# Patient Record
Sex: Female | Born: 1990
Health system: Southern US, Community
[De-identification: ages and names within clinical notes are randomized; demographics above are authoritative.]

## PROBLEM LIST (undated history)

## (undated) DIAGNOSIS — F419 Anxiety disorder, unspecified: Secondary | ICD-10-CM

## (undated) HISTORY — PX: NO PAST SURGERIES: SHX2092

## (undated) HISTORY — DX: Anxiety disorder, unspecified: F41.9

---
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
## 2008-04-25 ENCOUNTER — Inpatient Hospital Stay: Payer: Self-pay | Admitting: Obstetrics and Gynecology

## 2014-12-25 LAB — CBC AND DIFFERENTIAL
HCT: 43 % (ref 36–46)
Hemoglobin: 15.6 g/dL (ref 12.0–16.0)
NEUTROS ABS: 5 /uL
PLATELETS: 272 10*3/uL (ref 150–399)
WBC: 7.1 10^3/mL

## 2014-12-25 LAB — BASIC METABOLIC PANEL
BUN: 11 mg/dL (ref 4–21)
CREATININE: 0.8 mg/dL (ref 0.5–1.1)
GLUCOSE: 112 mg/dL
Potassium: 4 mmol/L (ref 3.4–5.3)
Sodium: 138 mmol/L (ref 137–147)

## 2014-12-25 LAB — LIPID PANEL
Cholesterol: 214 mg/dL — AB (ref 0–200)
HDL: 50 mg/dL (ref 35–70)
LDL CALC: 144 mg/dL
TRIGLYCERIDES: 101 mg/dL (ref 40–160)

## 2014-12-25 LAB — TSH: TSH: 2.99 u[IU]/mL (ref 0.41–5.90)

## 2015-01-01 ENCOUNTER — Telehealth: Payer: Self-pay | Admitting: Family Medicine

## 2015-01-01 NOTE — Telephone Encounter (Signed)
PT stated she started Venlafaxine on Friday 12/27/14 and she doesn't feel like it is helping and she feels like she is having muscle spasms and feels shaky. Pt request to speak with a nurse or maybe try something different. [facility] [address]

## 2015-01-01 NOTE — Telephone Encounter (Signed)
She should continue taking just one capsule a day for at least another week. She should call early next week if she is still having the muscle aches.

## 2015-01-01 NOTE — Telephone Encounter (Signed)
Patient reports that she was seen in office last Wednesday and prescribed Venlafaxine, she states that sig was one tablet tablet then to increase dosage to two tablets daily after six days?Patient continued in saying that since starting medication she has experienced muscle aches, tremors and fatigue ( especially in the AM). Patient takes medication at night time and is suppose to increase dosage today and wanted to know if she should increase to two tablets as prescribed? Or if what she is experiencing is just a common side effect from medication that will subside? Patient advised that Nadine CountsBob is out of the office this afternoon. Please advise. KW

## 2015-01-02 NOTE — Telephone Encounter (Signed)
I will send in generic Paxil to [facility] [address] Start at 1/2 pill daily for at least a week.

## 2015-01-02 NOTE — Telephone Encounter (Signed)
Patient advised as directed below. Patient states she is so shaky that she can not function continuing taking the Venlafaxine for another week. Patient states she was told if she was unable to handle the medication to call back to get a different medication. Please advise.

## 2015-01-02 NOTE — Telephone Encounter (Signed)
Patient notified. Patient canceled f/u appt for 01/06/15 and will reschedule for the following week.

## 2015-01-02 NOTE — Telephone Encounter (Signed)
Pt called back. Lowella Bandyikki took the call. Thanks TNP

## 2015-01-06 ENCOUNTER — Ambulatory Visit: Payer: Self-pay | Admitting: Family Medicine

## 2015-06-25 ENCOUNTER — Other Ambulatory Visit: Payer: Self-pay | Admitting: Family Medicine

## 2015-06-25 ENCOUNTER — Telehealth: Payer: Self-pay | Admitting: Family Medicine

## 2015-06-25 DIAGNOSIS — F410 Panic disorder [episodic paroxysmal anxiety] without agoraphobia: Secondary | ICD-10-CM

## 2015-06-25 MED ORDER — PAROXETINE HCL 20 MG PO TABS: 20.0000 mg | ORAL_TABLET | Freq: Every day | ORAL | Status: DC

## 2015-06-25 NOTE — Telephone Encounter (Signed)
Refill request

## 2015-06-25 NOTE — Telephone Encounter (Signed)
Pt needs refill for her generic Paxil 20 mg  [facility] [address]  Call back is [phone]  Thanks Barth Kirkseri

## 2015-07-05 ENCOUNTER — Other Ambulatory Visit: Payer: Self-pay | Admitting: Family Medicine

## 2015-07-07 ENCOUNTER — Other Ambulatory Visit: Payer: Self-pay | Admitting: Family Medicine

## 2015-09-02 ENCOUNTER — Other Ambulatory Visit: Payer: Self-pay | Admitting: Family Medicine

## 2016-02-26 DIAGNOSIS — F419 Anxiety disorder, unspecified: Secondary | ICD-10-CM | POA: Insufficient documentation

## 2016-02-26 DIAGNOSIS — F410 Panic disorder [episodic paroxysmal anxiety] without agoraphobia: Secondary | ICD-10-CM | POA: Insufficient documentation

## 2016-02-26 DIAGNOSIS — J309 Allergic rhinitis, unspecified: Secondary | ICD-10-CM | POA: Insufficient documentation

## 2016-02-26 DIAGNOSIS — F4000 Agoraphobia, unspecified: Secondary | ICD-10-CM | POA: Insufficient documentation

## 2016-03-14 ENCOUNTER — Other Ambulatory Visit: Payer: Self-pay | Admitting: Family Medicine

## 2016-03-15 ENCOUNTER — Encounter: Payer: Self-pay | Admitting: Family Medicine

## 2016-03-15 ENCOUNTER — Ambulatory Visit (INDEPENDENT_AMBULATORY_CARE_PROVIDER_SITE_OTHER): Payer: BLUE CROSS/BLUE SHIELD | Admitting: Family Medicine

## 2016-03-15 VITALS — BP 102/84 | HR 76 | Temp 99.0°F | Wt 179.4 lb

## 2016-03-15 DIAGNOSIS — E785 Hyperlipidemia, unspecified: Secondary | ICD-10-CM

## 2016-03-15 DIAGNOSIS — F419 Anxiety disorder, unspecified: Secondary | ICD-10-CM

## 2016-03-15 DIAGNOSIS — Z131 Encounter for screening for diabetes mellitus: Secondary | ICD-10-CM

## 2016-03-15 NOTE — Progress Notes (Signed)
Subjective:     Patient ID: Tara Foley, female   DOB: 03/15/1991, 25 y.o.   MRN: [id number]  HPI  Chief Complaint  Patient presents with  . Anxiety    Patient is present in office today to follow up for anxiety. According to phone message dated on 01/01/15 patient medication was changed from Venlafaxine to Paxil 20mg . Patient states today since starting Paxil her symptoms have greatly improved, no feelings of anxiety and has had no need to continue Klonopin  States she has no further panic attacks and agoraphobia is resolved. [facility] though no labs.   Review of Systems     Objective:   Physical Exam  Constitutional: She appears well-developed and well-nourished. No distress.  Psychiatric: She has a normal mood and affect. Her behavior is normal.       Assessment:    1. Anxiety: will refill paroxetine  2. Hyperlipidemia - Lipid panel  3. Screening for diabetes mellitus - Renal function panel    Plan:    Further f/u pending lab work

## 2016-03-16 DIAGNOSIS — E785 Hyperlipidemia, unspecified: Secondary | ICD-10-CM | POA: Diagnosis not present

## 2016-03-16 DIAGNOSIS — Z131 Encounter for screening for diabetes mellitus: Secondary | ICD-10-CM | POA: Diagnosis not present

## 2016-03-17 ENCOUNTER — Telehealth: Payer: Self-pay

## 2016-03-17 LAB — RENAL FUNCTION PANEL
Albumin: 4.2 g/dL (ref 3.5–5.5)
BUN/Creatinine Ratio: 13 (ref 9–23)
BUN: 11 mg/dL (ref 6–20)
CALCIUM: 9.6 mg/dL (ref 8.7–10.2)
CHLORIDE: 99 mmol/L (ref 96–106)
CO2: 23 mmol/L (ref 18–29)
CREATININE: 0.82 mg/dL (ref 0.57–1.00)
GFR calc Af Amer: 113 mL/min/{1.73_m2} (ref >59)
GFR calc non Af Amer: 98 mL/min/{1.73_m2} (ref >59)
Glucose: 91 mg/dL (ref 65–99)
PHOSPHORUS: 4 mg/dL (ref 2.5–4.5)
POTASSIUM: 4.3 mmol/L (ref 3.5–5.2)
SODIUM: 139 mmol/L (ref 134–144)

## 2016-03-17 LAB — LIPID PANEL
CHOLESTEROL TOTAL: 231 mg/dL — AB (ref 100–199)
Chol/HDL Ratio: 6.2 ratio units — ABNORMAL HIGH (ref 0.0–4.4)
HDL: 37 mg/dL — ABNORMAL LOW (ref >39)
LDL CALC: 150 mg/dL — AB (ref 0–99)
TRIGLYCERIDES: 218 mg/dL — AB (ref 0–149)
VLDL Cholesterol Cal: 44 mg/dL — ABNORMAL HIGH (ref 5–40)

## 2016-03-17 NOTE — Telephone Encounter (Signed)
-----   Message from Anola Gurneyobert Chauvin, [place] sent at 03/17/2016  7:38 AM EDT ----- Normal sugar but cholesterol is elevated. Recommend low fat food choices and regular exercise 30 minutes daily.

## 2016-03-17 NOTE — Telephone Encounter (Signed)
Patient has been advised. KW 

## 2016-03-21 DIAGNOSIS — S91331A Puncture wound without foreign body, right foot, initial encounter: Secondary | ICD-10-CM | POA: Diagnosis not present

## 2016-06-01 DIAGNOSIS — Z309 Encounter for contraceptive management, unspecified: Secondary | ICD-10-CM | POA: Diagnosis not present

## 2016-06-01 DIAGNOSIS — Z3041 Encounter for surveillance of contraceptive pills: Secondary | ICD-10-CM | POA: Diagnosis not present

## 2016-06-01 DIAGNOSIS — Z01419 Encounter for gynecological examination (general) (routine) without abnormal findings: Secondary | ICD-10-CM | POA: Diagnosis not present

## 2016-06-01 DIAGNOSIS — Z124 Encounter for screening for malignant neoplasm of cervix: Secondary | ICD-10-CM | POA: Diagnosis not present

## 2017-01-06 ENCOUNTER — Telehealth: Payer: Self-pay | Admitting: Family Medicine

## 2017-01-06 ENCOUNTER — Other Ambulatory Visit: Payer: Self-pay | Admitting: Family Medicine

## 2017-01-06 MED ORDER — PAROXETINE HCL 20 MG PO TABS: 20.0000 mg | ORAL_TABLET | Freq: Every day | ORAL | 0 refills | Status: DC

## 2017-01-06 NOTE — Telephone Encounter (Signed)
[facility] faxed a refill request on the following medications:  PARoxetine (PAXIL) 20 MG tablet.  Take 1 tablet by mouth every day.  90 day supply.  [facility]

## 2017-01-06 NOTE — Telephone Encounter (Signed)
Refilled-will be time for office visit this summer

## 2017-01-12 DIAGNOSIS — N390 Urinary tract infection, site not specified: Secondary | ICD-10-CM | POA: Diagnosis not present

## 2017-03-15 ENCOUNTER — Ambulatory Visit (INDEPENDENT_AMBULATORY_CARE_PROVIDER_SITE_OTHER): Payer: BLUE CROSS/BLUE SHIELD | Admitting: Family Medicine

## 2017-03-15 ENCOUNTER — Encounter: Payer: Self-pay | Admitting: Family Medicine

## 2017-03-15 VITALS — BP 104/76 | HR 125 | Temp 98.4°F | Resp 16 | Wt 191.2 lb

## 2017-03-15 DIAGNOSIS — A084 Viral intestinal infection, unspecified: Secondary | ICD-10-CM | POA: Diagnosis not present

## 2017-03-15 NOTE — Patient Instructions (Signed)
Discussed use of imodium, Gatorade or Pedialyte, and eating as tolerated.

## 2017-03-15 NOTE — Progress Notes (Signed)
Subjective:     Patient ID: Tara Foley, female   DOB: 06/26/1991, 26 y.o.   MRN: [id number]  HPI  Chief Complaint  Patient presents with  . Diarrhea    Patient comes in office today with complaints of diarrhea for the past 3 days. Patient states that on Saturday 03/12/17 her [place] went [facility] to eat [facility] [employer] and within 24hrs everyone in household complained of diarrhea and abdominal pain. Patient reports that she has had loose watery stools, body chills and low grade fever of 99.1.    States nausea has abated. Has had two watery stools today. Was able to tolerate cheese toast last night.   Review of Systems  Psychiatric/Behavioral:       States paroxetine is working well for her anxiety. Has formal f/u in the next few weeks.       Objective:   Physical Exam  Constitutional: She appears well-developed and well-nourished. No distress.  Abdominal: Bowel sounds are normal. There is no tenderness. There is no guarding.       Assessment:    1. Viral gastroenteritis: improving     Plan:    Discussed use of imodium, Gatorade or similar and eating as tolerated.

## 2017-04-07 ENCOUNTER — Other Ambulatory Visit: Payer: Self-pay | Admitting: Family Medicine

## 2017-04-07 ENCOUNTER — Telehealth: Payer: Self-pay | Admitting: Family Medicine

## 2017-04-07 MED ORDER — PAROXETINE HCL 20 MG PO TABS: 20.0000 mg | ORAL_TABLET | Freq: Every day | ORAL | 0 refills | Status: DC

## 2017-04-07 NOTE — Telephone Encounter (Signed)
Pt needs refill on her paroxetine 20 mg  [facility]  Thanks teri

## 2017-04-07 NOTE — Telephone Encounter (Signed)
Please review. KW 

## 2017-04-11 ENCOUNTER — Other Ambulatory Visit: Payer: Self-pay

## 2017-04-11 ENCOUNTER — Other Ambulatory Visit: Payer: Self-pay | Admitting: Obstetrics and Gynecology

## 2017-04-11 MED ORDER — NORETHIN ACE-ETH ESTRAD-FE 1-20 MG-MCG PO TABS: 1.0000 | ORAL_TABLET | Freq: Every day | ORAL | 1 refills | Status: DC

## 2017-04-11 NOTE — Telephone Encounter (Signed)
Refilled medication for pt to last her until her annual 11/6

## 2017-05-05 ENCOUNTER — Other Ambulatory Visit: Payer: Self-pay | Admitting: Obstetrics and Gynecology

## 2017-05-05 MED ORDER — NORETHIN ACE-ETH ESTRAD-FE 1-20 MG-MCG PO TABS: 1.0000 | ORAL_TABLET | Freq: Every day | ORAL | 1 refills | Status: DC

## 2017-05-05 NOTE — Telephone Encounter (Signed)
Pt aware rx sent in. 

## 2017-05-05 NOTE — Telephone Encounter (Signed)
Pt is schedule 06/07/17 with [facility] needs refill on birthcontrol to get to her appt. [facility]. Pt contact pt.

## 2017-06-07 ENCOUNTER — Ambulatory Visit (INDEPENDENT_AMBULATORY_CARE_PROVIDER_SITE_OTHER): Payer: BLUE CROSS/BLUE SHIELD | Admitting: Obstetrics and Gynecology

## 2017-06-07 ENCOUNTER — Encounter: Payer: Self-pay | Admitting: Obstetrics and Gynecology

## 2017-06-07 VITALS — BP 134/80 | Ht 66.0 in | Wt 195.0 lb

## 2017-06-07 DIAGNOSIS — Z3041 Encounter for surveillance of contraceptive pills: Secondary | ICD-10-CM | POA: Diagnosis not present

## 2017-06-07 DIAGNOSIS — Z124 Encounter for screening for malignant neoplasm of cervix: Secondary | ICD-10-CM | POA: Diagnosis not present

## 2017-06-07 DIAGNOSIS — Z01419 Encounter for gynecological examination (general) (routine) without abnormal findings: Secondary | ICD-10-CM | POA: Diagnosis not present

## 2017-06-07 MED ORDER — NORETHIN ACE-ETH ESTRAD-FE 1-20 MG-MCG PO TABS: 1.0000 | ORAL_TABLET | Freq: Every day | ORAL | 3 refills | Status: DC

## 2017-06-07 NOTE — Patient Instructions (Signed)
I value your feedback and appreciate you entrusting us with your care. If you get a [place] patient survey, I would appreciate you taking the time to let us know what your experience was like. Thank you! 

## 2017-06-07 NOTE — Progress Notes (Signed)
PCP:  Anola Gurneyhauvin, Robert, PA   Chief Complaint  Patient presents with  . Annual Exam     HPI:      [name] is a 26 y.o. G1P1001 who LMP was Patient's last menstrual period was 06/02/2017., presents today for her annual examination.  Her menses are regular every 28-30 days, lasting 3 days.  Dysmenorrhea none. She does not have intermenstrual bleeding.  Sex activity: single partner, contraception - OCP (estrogen/progesterone).  Last Pap: 06/01/16  Results were: no abnormalities  Hx of STDs: none  There is no FH of breast cancer. There is no FH of ovarian cancer. The patient does do self-breast exams.  Tobacco use: The patient denies current or previous tobacco use. Alcohol use: none No drug use.  Exercise: moderately active  She does get adequate calcium but not Vitamin D in her diet.   Past Medical History:  Diagnosis Date  . Anxiety     Past Surgical History:  Procedure Laterality Date  . NO PAST SURGERIES      Family History  Problem Relation Age of Onset  . Hypertension Mother     Social History   Socioeconomic History  . Marital status: Single    Spouse name: Not on file  . Number of children: Not on file  . Years of education: Not on file  . Highest education level: Not on file  Social Needs  . Financial resource strain: Not on file  . Food insecurity - worry: Not on file  . Food insecurity - inability: Not on file  . Transportation needs - medical: Not on file  . Transportation needs - non-medical: Not on file  Occupational History  . Not on file  Tobacco Use  . Smoking status: Never Smoker  . Smokeless tobacco: Never Used  Substance and Sexual Activity  . Alcohol use: No    Frequency: Never  . Drug use: No  . Sexual activity: Yes    Birth control/protection: Pill  Other Topics Concern  . Not on file  Social History Narrative  . Not on file    Current Meds  Medication Sig  . norethindrone-ethinyl estradiol (BLISOVI FE 1/20)  1-20 MG-MCG tablet Take 1 tablet daily by mouth.  Marland Kitchen. PARoxetine (PAXIL) 20 MG tablet Take 1 tablet (20 mg total) by mouth daily.  . [DISCONTINUED] norethindrone-ethinyl estradiol (BLISOVI FE 1/20) 1-20 MG-MCG tablet Take 1 tablet by mouth daily.     ROS:  Review of Systems  Constitutional: Negative for fatigue, fever and unexpected weight change.  Respiratory: Negative for cough, shortness of breath and wheezing.   Cardiovascular: Negative for chest pain, palpitations and leg swelling.  Gastrointestinal: Negative for blood in stool, constipation, diarrhea, nausea and vomiting.  Endocrine: Negative for cold intolerance, heat intolerance and polyuria.  Genitourinary: Negative for dyspareunia, dysuria, flank pain, frequency, genital sores, hematuria, menstrual problem, pelvic pain, urgency, vaginal bleeding, vaginal discharge and vaginal pain.  Musculoskeletal: Negative for back pain, joint swelling and myalgias.  Skin: Negative for rash.  Neurological: Negative for dizziness, syncope, light-headedness, numbness and headaches.  Hematological: Negative for adenopathy.  Psychiatric/Behavioral: Negative for agitation, confusion, sleep disturbance and suicidal ideas. The patient is not nervous/anxious.      Objective: BP 134/80   Ht 5\' 6"  (1.676 m)   Wt 195 lb (88.5 kg)   LMP 06/02/2017   BMI 31.47 kg/m    Physical Exam  Constitutional: She is oriented to person, place, and time. She appears well-developed and  well-nourished.  Genitourinary: Vagina normal and uterus normal. There is no rash or tenderness on the right labia. There is no rash or tenderness on the left labia. No erythema or tenderness in the vagina. No vaginal discharge found. Right adnexum does not display mass and does not display tenderness. Left adnexum does not display mass and does not display tenderness. Cervix does not exhibit motion tenderness or polyp. Uterus is not enlarged or tender.  Neck: Normal range of motion.  No thyromegaly present.  Cardiovascular: Normal rate, regular rhythm and normal heart sounds.  No murmur heard. Pulmonary/Chest: Effort normal and breath sounds normal. Right breast exhibits no mass, no nipple discharge, no skin change and no tenderness. Left breast exhibits no mass, no nipple discharge, no skin change and no tenderness.  Abdominal: Soft. There is no tenderness. There is no guarding.  Musculoskeletal: Normal range of motion.  Neurological: She is alert and oriented to person, place, and time. No cranial nerve deficit.  Psychiatric: She has a normal mood and affect. Her behavior is normal.  Vitals reviewed.   Assessment/Plan: Encounter for annual routine gynecological examination  Cervical cancer screening - Plan: IGP, rfx Aptima HPV ASCU  Encounter for surveillance of contraceptive pills - OCP RF. - Plan: norethindrone-ethinyl estradiol (BLISOVI FE 1/20) 1-20 MG-MCG tablet  Meds ordered this encounter  Medications  . norethindrone-ethinyl estradiol (BLISOVI FE 1/20) 1-20 MG-MCG tablet    Sig: Take 1 tablet daily by mouth.    Dispense:  3 Package    Refill:  3             GYN counsel adequate intake of calcium and vitamin D, diet and exercise     F/U  Return in about 1 year (around 06/07/2018).  Estelene Carmack B. Lashina Milles, PA-C 06/07/2017 9:27 AM

## 2017-06-09 LAB — IGP, RFX APTIMA HPV ASCU: PAP SMEAR COMMENT: 0

## 2017-07-13 ENCOUNTER — Other Ambulatory Visit: Payer: Self-pay | Admitting: Family Medicine

## 2017-07-14 MED ORDER — PAROXETINE HCL 20 MG PO TABS: 20.0000 mg | ORAL_TABLET | Freq: Every day | ORAL | 1 refills | Status: DC

## 2017-10-10 DIAGNOSIS — L719 Rosacea, unspecified: Secondary | ICD-10-CM | POA: Diagnosis not present

## 2017-10-10 DIAGNOSIS — L858 Other specified epidermal thickening: Secondary | ICD-10-CM | POA: Diagnosis not present

## 2017-10-10 DIAGNOSIS — L821 Other seborrheic keratosis: Secondary | ICD-10-CM | POA: Diagnosis not present

## 2017-10-10 DIAGNOSIS — D229 Melanocytic nevi, unspecified: Secondary | ICD-10-CM | POA: Diagnosis not present

## 2018-06-06 ENCOUNTER — Other Ambulatory Visit: Payer: Self-pay | Admitting: Obstetrics and Gynecology

## 2018-06-06 ENCOUNTER — Other Ambulatory Visit: Payer: Self-pay

## 2018-06-06 DIAGNOSIS — Z3041 Encounter for surveillance of contraceptive pills: Secondary | ICD-10-CM

## 2018-06-06 MED ORDER — NORETHIN ACE-ETH ESTRAD-FE 1-20 MG-MCG PO TABS: 1.0000 | ORAL_TABLET | Freq: Every day | ORAL | 0 refills | Status: DC

## 2018-06-06 NOTE — Telephone Encounter (Signed)
Pt calling for refill of bc to last her to her appt 12/11.  Uses [facility]  [phone] pt aware refills sent in.

## 2018-07-11 NOTE — Progress Notes (Signed)
PCP:  Anola Gurney, PA   Chief Complaint  Patient presents with  . Gynecologic Exam     HPI:      [name] is a 27 y.o. G1P1001 who LMP was Patient's last menstrual period was 06/28/2018 (exact date)., presents today for her annual examination.  Her menses are regular every 28-30 days, lasting 3-4 days.  Dysmenorrhea none. She does not have intermenstrual bleeding.  Sex activity: single partner, contraception - OCP (estrogen/progesterone).  Last Pap: 06/07/17  Results were: no abnormalities  Hx of STDs: none  There is no FH of breast cancer. There is no FH of ovarian cancer. The patient does do self-breast exams.  Tobacco use: The patient denies current or previous tobacco use. Alcohol use: none No drug use.  Exercise: moderately active  She does get adequate calcium and Vitamin D in her diet. She would like fasting labs. Has had borderline lipids in past.   Past Medical History:  Diagnosis Date  . Anxiety     Past Surgical History:  Procedure Laterality Date  . NO PAST SURGERIES      Family History  Problem Relation Age of Onset  . Hypertension Mother     Social History   Socioeconomic History  . Marital status: Married    Spouse name: Not on file  . Number of children: Not on file  . Years of education: Not on file  . Highest education level: Not on file  Occupational History  . Not on file  Social Needs  . Financial resource strain: Not on file  . Food insecurity:    Worry: Not on file    Inability: Not on file  . Transportation needs:    Medical: Not on file    Non-medical: Not on file  Tobacco Use  . Smoking status: Never Smoker  . Smokeless tobacco: Never Used  Substance and Sexual Activity  . Alcohol use: No    Frequency: Never  . Drug use: No  . Sexual activity: Yes    Birth control/protection: Pill  Lifestyle  . Physical activity:    Days per week: Not on file    Minutes per session: Not on file  . Stress: Not on file    Relationships  . Social connections:    Talks on phone: Not on file    Gets together: Not on file    Attends religious service: Not on file    Active member of club or organization: Not on file    Attends meetings of clubs or organizations: Not on file    Relationship status: Not on file  . Intimate partner violence:    Fear of current or ex partner: Not on file    Emotionally abused: Not on file    Physically abused: Not on file    Forced sexual activity: Not on file  Other Topics Concern  . Not on file  Social History Narrative  . Not on file    Current Meds  Medication Sig  . norethindrone-ethinyl estradiol (BLISOVI FE 1/20) 1-20 MG-MCG tablet Take 1 tablet by mouth daily.  . [DISCONTINUED] norethindrone-ethinyl estradiol (BLISOVI FE 1/20) 1-20 MG-MCG tablet Take 1 tablet by mouth daily.     ROS:  Review of Systems  Constitutional: Negative for fatigue, fever and unexpected weight change.  Respiratory: Negative for cough, shortness of breath and wheezing.   Cardiovascular: Negative for chest pain, palpitations and leg swelling.  Gastrointestinal: Negative for blood in stool, constipation, diarrhea,  nausea and vomiting.  Endocrine: Negative for cold intolerance, heat intolerance and polyuria.  Genitourinary: Negative for dyspareunia, dysuria, flank pain, frequency, genital sores, hematuria, menstrual problem, pelvic pain, urgency, vaginal bleeding, vaginal discharge and vaginal pain.  Musculoskeletal: Negative for back pain, joint swelling and myalgias.  Skin: Negative for rash.  Neurological: Negative for dizziness, syncope, light-headedness, numbness and headaches.  Hematological: Negative for adenopathy.  Psychiatric/Behavioral: Negative for agitation, confusion, sleep disturbance and suicidal ideas. The patient is not nervous/anxious.      Objective: BP (!) 150/84   Pulse (!) 110   Ht 5\' 6"  (1.676 m)   Wt 191 lb (86.6 kg)   LMP 06/28/2018 (Exact Date)   BMI  30.83 kg/m    Physical Exam  Constitutional: She is oriented to person, place, and time. She appears well-developed and well-nourished.  Genitourinary: Vagina normal and uterus normal. There is no rash or tenderness on the right labia. There is no rash or tenderness on the left labia. No erythema or tenderness in the vagina. No vaginal discharge found. Right adnexum does not display mass and does not display tenderness. Left adnexum does not display mass and does not display tenderness. Cervix does not exhibit motion tenderness or polyp. Uterus is not enlarged or tender.  Neck: Normal range of motion. No thyromegaly present.  Cardiovascular: Normal rate, regular rhythm and normal heart sounds.  No murmur heard. Pulmonary/Chest: Effort normal and breath sounds normal. Right breast exhibits no mass, no nipple discharge, no skin change and no tenderness. Left breast exhibits no mass, no nipple discharge, no skin change and no tenderness.  Abdominal: Soft. There is no tenderness. There is no guarding.  Musculoskeletal: Normal range of motion.  Neurological: She is alert and oriented to person, place, and time. No cranial nerve deficit.  Psychiatric: She has a normal mood and affect. Her behavior is normal.  Vitals reviewed.   Assessment/Plan: Encounter for annual routine gynecological examination  Cervical cancer screening - Plan: Cytology - PAP  Encounter for surveillance of contraceptive pills - OCP RF - Plan: norethindrone-ethinyl estradiol (BLISOVI FE 1/20) 1-20 MG-MCG tablet  Blood tests for routine general physical examination - Plan: Comprehensive metabolic panel, Lipid panel, Hemoglobin A1c  Screening cholesterol level - Plan: Lipid panel  Screening for diabetes mellitus - Plan: Hemoglobin A1c  BMI 30.0-30.9,adult - Plan: Hemoglobin A1c  Meds ordered this encounter  Medications  . norethindrone-ethinyl estradiol (BLISOVI FE 1/20) 1-20 MG-MCG tablet    Sig: Take 1 tablet by  mouth daily.    Dispense:  3 Package    Refill:  3    Order Specific Question:   Supervising Provider    Answer:   Nadara MustardHARRIS, ROBERT P [[id number]]             GYN counsel adequate intake of calcium and vitamin D, diet and exercise     F/U  Return in about 1 year (around 07/13/2019).  Alicia B. Copland, PA-C 07/12/2018 9:05 AM

## 2018-07-11 NOTE — Patient Instructions (Signed)
I value your feedback and entrusting us with your care. If you get a [place] patient survey, I would appreciate you taking the time to let us know about your experience today. Thank you! 

## 2018-07-12 ENCOUNTER — Ambulatory Visit (INDEPENDENT_AMBULATORY_CARE_PROVIDER_SITE_OTHER): Payer: BLUE CROSS/BLUE SHIELD | Admitting: Obstetrics and Gynecology

## 2018-07-12 ENCOUNTER — Other Ambulatory Visit (HOSPITAL_COMMUNITY)
Admission: RE | Admit: 2018-07-12 | Discharge: 2018-07-12 | Disposition: A | Discharge status: Home / Self Care | Payer: BLUE CROSS/BLUE SHIELD | Source: Ambulatory Visit | Attending: Obstetrics and Gynecology | Admitting: Obstetrics and Gynecology

## 2018-07-12 ENCOUNTER — Encounter: Payer: Self-pay | Admitting: Obstetrics and Gynecology

## 2018-07-12 VITALS — BP 150/84 | HR 110 | Ht 66.0 in | Wt 191.0 lb

## 2018-07-12 DIAGNOSIS — Z01419 Encounter for gynecological examination (general) (routine) without abnormal findings: Secondary | ICD-10-CM

## 2018-07-12 DIAGNOSIS — Z3041 Encounter for surveillance of contraceptive pills: Secondary | ICD-10-CM

## 2018-07-12 DIAGNOSIS — Z13220 Encounter for screening for lipoid disorders: Secondary | ICD-10-CM | POA: Diagnosis not present

## 2018-07-12 DIAGNOSIS — Z124 Encounter for screening for malignant neoplasm of cervix: Secondary | ICD-10-CM | POA: Insufficient documentation

## 2018-07-12 DIAGNOSIS — Z0000 Encounter for general adult medical examination without abnormal findings: Secondary | ICD-10-CM | POA: Diagnosis not present

## 2018-07-12 DIAGNOSIS — Z6830 Body mass index (BMI) 30.0-30.9, adult: Secondary | ICD-10-CM | POA: Diagnosis not present

## 2018-07-12 DIAGNOSIS — Z131 Encounter for screening for diabetes mellitus: Secondary | ICD-10-CM | POA: Diagnosis not present

## 2018-07-12 MED ORDER — NORETHIN ACE-ETH ESTRAD-FE 1-20 MG-MCG PO TABS: 1.0000 | ORAL_TABLET | Freq: Every day | ORAL | 3 refills | Status: DC

## 2018-07-13 LAB — COMPREHENSIVE METABOLIC PANEL
ALK PHOS: 71 IU/L (ref 39–117)
ALT: 67 IU/L — AB (ref 0–32)
AST: 42 IU/L — ABNORMAL HIGH (ref 0–40)
Albumin/Globulin Ratio: 1.6 (ref 1.2–2.2)
Albumin: 4.6 g/dL (ref 3.5–5.5)
BILIRUBIN TOTAL: 0.2 mg/dL (ref 0.0–1.2)
BUN/Creatinine Ratio: 13 (ref 9–23)
BUN: 10 mg/dL (ref 6–20)
CALCIUM: 9.9 mg/dL (ref 8.7–10.2)
CO2: 20 mmol/L (ref 20–29)
CREATININE: 0.75 mg/dL (ref 0.57–1.00)
Chloride: 103 mmol/L (ref 96–106)
GFR calc non Af Amer: 108 mL/min/{1.73_m2} (ref >59)
GFR, EST AFRICAN AMERICAN: 125 mL/min/{1.73_m2} (ref >59)
GLOBULIN, TOTAL: 2.9 g/dL (ref 1.5–4.5)
Glucose: 97 mg/dL (ref 65–99)
Potassium: 4.2 mmol/L (ref 3.5–5.2)
Sodium: 143 mmol/L (ref 134–144)
Total Protein: 7.5 g/dL (ref 6.0–8.5)

## 2018-07-13 LAB — LIPID PANEL
CHOLESTEROL TOTAL: 237 mg/dL — AB (ref 100–199)
Chol/HDL Ratio: 6.8 ratio — ABNORMAL HIGH (ref 0.0–4.4)
HDL: 35 mg/dL — AB (ref >39)
LDL Calculated: 167 mg/dL — ABNORMAL HIGH (ref 0–99)
Triglycerides: 177 mg/dL — ABNORMAL HIGH (ref 0–149)
VLDL Cholesterol Cal: 35 mg/dL (ref 5–40)

## 2018-07-13 LAB — HEMOGLOBIN A1C
ESTIMATED AVERAGE GLUCOSE: 111 mg/dL
HEMOGLOBIN A1C: 5.5 % (ref 4.8–5.6)

## 2018-07-13 LAB — CYTOLOGY - PAP: Diagnosis: NEGATIVE

## 2018-07-13 NOTE — Progress Notes (Signed)
Pt aware.

## 2018-07-13 NOTE — Progress Notes (Signed)
Pls let pt know labs released through MyChart. Has slightly elevated liver enzymes, probably due to weight/fatty liver. Also has high cholesterol. Diet/exercise/wt loss for cholesterol and LFTs. Pt needs to f/u with PCP for mgmt.

## 2018-09-27 DIAGNOSIS — N390 Urinary tract infection, site not specified: Secondary | ICD-10-CM | POA: Diagnosis not present

## 2018-09-27 DIAGNOSIS — R0981 Nasal congestion: Secondary | ICD-10-CM | POA: Diagnosis not present

## 2018-10-09 ENCOUNTER — Encounter: Payer: Self-pay | Admitting: Physician Assistant

## 2018-10-09 ENCOUNTER — Ambulatory Visit: Payer: BLUE CROSS/BLUE SHIELD | Admitting: Physician Assistant

## 2018-10-09 VITALS — BP 137/99 | HR 129 | Temp 98.7°F | Wt 178.8 lb

## 2018-10-09 DIAGNOSIS — F419 Anxiety disorder, unspecified: Secondary | ICD-10-CM

## 2018-10-09 DIAGNOSIS — Z111 Encounter for screening for respiratory tuberculosis: Secondary | ICD-10-CM

## 2018-10-09 DIAGNOSIS — Z789 Other specified health status: Secondary | ICD-10-CM | POA: Diagnosis not present

## 2018-10-09 MED ORDER — PAROXETINE HCL 20 MG PO TABS: 20.0000 mg | ORAL_TABLET | Freq: Every day | ORAL | 1 refills | Status: DC

## 2018-10-09 NOTE — Progress Notes (Signed)
Patient: Tara Foley Female    DOB: Jan 12, 1991   28 y.o.   MRN: [id number] Visit Date: 10/15/2018  Today's Provider: Trinna Post, PA-C   Chief Complaint  Patient presents with  . Anxiety   Subjective:     HPI  AnxietyPatient present today for anxiety follow-up. Patient was last seen for anxiety on 03/15/2016. She is currently taking Paxil 10 mg tablets daily and states that the medication has helped her in her past. She would like to take 20 mg as this is what she was initially prescribed and she feels her anxiety has increased.  Also has job form to be completed today.  BP Readings from Last 8 Encounters:  10/09/18 (!) 137/99  07/12/18 (!) 150/84  06/07/17 134/80  03/15/17 104/76  03/15/16 102/84  12/25/14 134/82     Wt Readings from Last 3 Encounters:  10/09/18 178 lb 12.8 oz (81.1 kg)  07/12/18 191 lb (86.6 kg)  06/07/17 195 lb (88.5 kg)     No Known Allergies   Current Outpatient Medications:  .  norethindrone-ethinyl estradiol (BLISOVI FE 1/20) 1-20 MG-MCG tablet, Take 1 tablet by mouth daily., Disp: 3 Package, Rfl: 3 .  PARoxetine (PAXIL) 20 MG tablet, Take 1 tablet (20 mg total) by mouth daily., Disp: 90 tablet, Rfl: 1  Review of Systems  HENT: Negative.   Respiratory: Negative.   Genitourinary: Negative.   Neurological: Negative.     Social History   Tobacco Use  . Smoking status: Never Smoker  . Smokeless tobacco: Never Used  Substance Use Topics  . Alcohol use: No    Frequency: Never      Objective:   BP (!) 137/99 (BP Location: Right Arm, Patient Position: Sitting, Cuff Size: Normal)   Pulse (!) 129   Temp 98.7 F (37.1 C) (Oral)   Wt 178 lb 12.8 oz (81.1 kg)   LMP 09/18/2018   SpO2 98%   BMI 28.86 kg/m  Vitals:   10/09/18 1633  BP: (!) 137/99  Pulse: (!) 129  Temp: 98.7 F (37.1 C)  TempSrc: Oral  SpO2: 98%  Weight: 178 lb 12.8 oz (81.1 kg)     Physical Exam Constitutional:      Appearance: Normal  appearance.  Cardiovascular:     Rate and Rhythm: Normal rate and regular rhythm.  Pulmonary:     Effort: Pulmonary effort is normal.     Breath sounds: Normal breath sounds.  Skin:    General: Skin is warm and dry.  Neurological:     Mental Status: She is alert and oriented to person, place, and time. Mental status is at baseline.  Psychiatric:        Mood and Affect: Mood normal.        Behavior: Behavior normal.         Assessment & Plan    1. Anxiety  Increase Paxil to 20 mg daily.   - PARoxetine (PAXIL) 20 MG tablet; Take 1 tablet (20 mg total) by mouth daily.  Dispense: 90 tablet; Refill: 1  2. Screening-pulmonary TB  Test for TB and check Hep B titers due to status unknown. Patient UTD on Td and MMR. Sign form pending labwork.  - QuantiFERON-TB Gold Plus  3. Hepatitis B vaccination status unknown  - Hepatitis B Surface AntiBODY - Hepatitis B Core Antibody, total  The entirety of the information documented in the History of Present Illness, Review of Systems and Physical Exam were  personally obtained by me. Portions of this information were initially documented by [facility], CMA and reviewed by me for thoroughness and accuracy.   Return in about 6 months (around 04/11/2019) for anxiety.       Trinna Post, PA-C  [facility]

## 2018-10-10 ENCOUNTER — Encounter: Payer: Self-pay | Admitting: Physician Assistant

## 2018-10-12 DIAGNOSIS — Z111 Encounter for screening for respiratory tuberculosis: Secondary | ICD-10-CM | POA: Diagnosis not present

## 2018-10-12 DIAGNOSIS — Z789 Other specified health status: Secondary | ICD-10-CM | POA: Diagnosis not present

## 2018-10-14 LAB — QUANTIFERON-TB GOLD PLUS
QuantiFERON Mitogen Value: >10 IU/mL
QuantiFERON Nil Value: 0.01 IU/mL
QuantiFERON TB1 Ag Value: 0.01 IU/mL
QuantiFERON TB2 Ag Value: 0.01 IU/mL
QuantiFERON-TB Gold Plus: NEGATIVE

## 2018-10-14 LAB — HEPATITIS B CORE ANTIBODY, TOTAL: Hep B Core Total Ab: NEGATIVE

## 2018-10-14 LAB — HEPATITIS B SURFACE ANTIBODY,QUALITATIVE: Hep B Surface Ab, Qual: REACTIVE

## 2018-10-16 ENCOUNTER — Telehealth: Payer: Self-pay

## 2018-10-16 ENCOUNTER — Telehealth: Payer: Self-pay | Admitting: Physician Assistant

## 2018-10-16 NOTE — Telephone Encounter (Signed)
Pt calling for the status of the Health Assessment papers she left at last weeks visit for Adriana to fill out.  Please advise.  Thanks, [employer]

## 2018-10-16 NOTE — Telephone Encounter (Signed)
Patient advised as directed below. 

## 2018-10-16 NOTE — Telephone Encounter (Signed)
-----   Message from Trey Sailors, [place] sent at 10/16/2018  1:24 PM EDT ----- TB testing negative. Immune to Hep B. Can we fill this vaccine info in on her form and then please hand me form to sign and place up front for pick up.

## 2018-10-17 NOTE — Telephone Encounter (Signed)
Please see other telephone encounter. Form and labs placed up front ready for pick up and patient was advised and reports she is going to stop by to pick up paper work.

## 2018-11-08 ENCOUNTER — Encounter: Payer: Self-pay | Admitting: Physician Assistant

## 2018-11-08 NOTE — Telephone Encounter (Signed)
Accidentally sent to me

## 2019-07-19 ENCOUNTER — Other Ambulatory Visit: Payer: Self-pay

## 2019-07-19 DIAGNOSIS — Z3041 Encounter for surveillance of contraceptive pills: Secondary | ICD-10-CM

## 2019-07-19 MED ORDER — NORETHIN ACE-ETH ESTRAD-FE 1-20 MG-MCG PO TABS: 1.0000 | ORAL_TABLET | Freq: Every day | ORAL | 0 refills | Status: DC

## 2019-08-16 ENCOUNTER — Telehealth: Payer: Self-pay

## 2019-08-16 ENCOUNTER — Other Ambulatory Visit: Payer: Self-pay

## 2019-08-16 DIAGNOSIS — Z3041 Encounter for surveillance of contraceptive pills: Secondary | ICD-10-CM

## 2019-08-16 MED ORDER — NORETHIN ACE-ETH ESTRAD-FE 1-20 MG-MCG PO TABS: 1.0000 | ORAL_TABLET | Freq: Every day | ORAL | 0 refills | Status: DC

## 2019-08-16 NOTE — Telephone Encounter (Signed)
Pt states she has an appointment coming up with [facility], wants to know if it could be switched to virtual or tele,due to so many covid case's and her insurance only covers pap's every 2 years, she had that done last year, please advise

## 2019-08-16 NOTE — Telephone Encounter (Signed)
Pt would like to delay her annual and needs [facility] refill. I have sent refills for the next two months. Pls call pt to reschedule annual and see if she's is okay to come in around that time frame.

## 2019-08-16 NOTE — Telephone Encounter (Signed)
Annuals should be done in person and insurance covers them yearly. Pap smears (specimen only) are done every 3 years and not covered yearly, but she still needs full vaginal/pelvic exam yearly. We can delay her annual until covid calms down if she prefers, and I can send in any refills for a few extra months. Pls discuss with pt. Thx.

## 2019-08-21 NOTE — Telephone Encounter (Signed)
Patient is reschedule to 10/09/19 with [facility]

## 2019-08-22 ENCOUNTER — Ambulatory Visit: Payer: BLUE CROSS/BLUE SHIELD | Admitting: Obstetrics and Gynecology

## 2019-09-06 ENCOUNTER — Other Ambulatory Visit: Payer: Self-pay | Admitting: Adult Health

## 2019-09-06 ENCOUNTER — Other Ambulatory Visit: Payer: Self-pay

## 2019-09-06 ENCOUNTER — Ambulatory Visit: Payer: Managed Care, Other (non HMO) | Admitting: Adult Health

## 2019-09-06 ENCOUNTER — Encounter: Payer: Self-pay | Admitting: Adult Health

## 2019-09-06 VITALS — BP 136/90 | HR 102 | Temp 98.1°F | Resp 16 | Wt 186.6 lb

## 2019-09-06 DIAGNOSIS — R002 Palpitations: Secondary | ICD-10-CM | POA: Diagnosis not present

## 2019-09-06 DIAGNOSIS — R69 Illness, unspecified: Secondary | ICD-10-CM | POA: Diagnosis not present

## 2019-09-06 DIAGNOSIS — F419 Anxiety disorder, unspecified: Secondary | ICD-10-CM | POA: Diagnosis not present

## 2019-09-06 NOTE — Patient Instructions (Addendum)
Psychiatric/Counseling Resources Discussed As Follows:  If Emergency please seek Emergency Room Care Immediately or Call 911.   [facility] Address:  [place], [place] [address] Phone: [phone] Website : [email or web address]   [facility] Address:  [address] [place], [place] [address] Phone: [phone] Fax: [phone] Website: [email or web address] How To Access Our Services Because our main goal is to meet the needs of our consumers, [facility] operates on a walk-in basis! To access services, there are just 3 easy steps: 1) Walk in any Monday, Wednesday or Friday between 8:00 am and 3:00 pm and complete our consumer paperwork 2) A Comprehensive Clinical Assessment (CCA) will be completed and appropriate service recommendations will be provided 3) Recommendations are sent to [facility] team members and the appropriate staff will call you within days. Advanced Access Open M - F, 8:00 am - 8:00 pm  Mental health crisis services for all age groups  Triage  Psychiatric Evaluations  Involuntary Commitments  [facility]  Address: [address], [place], [place] [address] Website : [email or web address] Walk in's accepted see web site or call for more information Phone : [phone] Also has [facility] [phone]    Psychology Today Find a therapist by searching online in your area or specialist by your diagnosis Website:  https://www.psychologytoday.com/us      Managing Anxiety, Adult After being diagnosed with an anxiety disorder, you may be relieved to know why you have felt or behaved a certain way. You may also feel overwhelmed about the treatment ahead and what it will mean for your life. With care and support, you can manage this condition and recover from it. How to manage lifestyle changes Managing stress and anxiety  Stress is your  body's reaction to life changes and events, both good and bad. Most stress will last just a few hours, but stress can be ongoing and can lead to more than just stress. Although stress can play a major role in anxiety, it is not the same as anxiety. Stress is usually caused by something external, such as a deadline, test, or competition. Stress normally passes after the triggering event has ended.  Anxiety is caused by something internal, such as imagining a terrible outcome or worrying that something will go wrong that will devastate you. Anxiety often does not go away even after the triggering event is over, and it can become long-term (chronic) worry. It is important to understand the differences between stress and anxiety and to manage your stress effectively so that it does not lead to an anxious response. Talk with your health care provider or a counselor to learn more about reducing anxiety and stress. He or she may suggest tension reduction techniques, such as:  Music therapy. This can include creating or listening to music that you enjoy and that inspires you.  Mindfulness-based meditation. This involves being aware of your normal breaths while not trying to control your breathing. It can be done while sitting or walking.  Centering prayer. This involves focusing on a word, phrase, or sacred image that means something to you and brings you peace.  Deep breathing. To do this, expand your stomach and inhale slowly through your nose. Hold your breath for 3-5 seconds. Then exhale slowly, letting your stomach muscles relax.  Self-talk. This involves identifying thought patterns that lead to anxiety reactions and changing those patterns.  Muscle relaxation. This involves tensing muscles and then relaxing them. Choose a tension reduction technique  that suits your lifestyle and personality. These techniques take time and practice. Set aside 5-15 minutes a day to do them. Therapists can offer  counseling and training in these techniques. The training to help with anxiety may be covered by some insurance plans. Other things you can do to manage stress and anxiety include:  Keeping a stress/anxiety diary. This can help you learn what triggers your reaction and then learn ways to manage your response.  Thinking about how you react to certain situations. You may not be able to control everything, but you can control your response.  Making time for activities that help you relax and not feeling guilty about spending your time in this way.  Visual imagery and yoga can help you stay calm and relax.  Medicines Medicines can help ease symptoms. Medicines for anxiety include:  Anti-anxiety drugs.  Antidepressants. Medicines are often used as a primary treatment for anxiety disorder. Medicines will be prescribed by a health care provider. When used together, medicines, psychotherapy, and tension reduction techniques may be the most effective treatment. Relationships Relationships can play a big part in helping you recover. Try to spend more time connecting with trusted friends and family members. Consider going to couples counseling, taking family education classes, or going to family therapy. Therapy can help you and others better understand your condition. How to recognize changes in your anxiety Everyone responds differently to treatment for anxiety. Recovery from anxiety happens when symptoms decrease and stop interfering with your daily activities at home or work. This may mean that you will start to:  Have better concentration and focus. Worry will interfere less in your daily thinking.  Sleep better.  Be less irritable.  Have more energy.  Have improved memory. It is important to recognize when your condition is getting worse. Contact your health care provider if your symptoms interfere with home or work and you feel like your condition is not improving. Follow these  instructions at home: Activity  Exercise. Most adults should do the following: ? Exercise for at least 150 minutes each week. The exercise should increase your heart rate and make you sweat (moderate-intensity exercise). ? Strengthening exercises at least twice a week.  Get the right amount and quality of sleep. Most adults need 7-9 hours of sleep each night. Lifestyle   Eat a healthy diet that includes plenty of vegetables, fruits, whole grains, low-fat dairy products, and lean protein. Do not eat a lot of foods that are high in solid fats, added sugars, or salt.  Make choices that simplify your life.  Do not use any products that contain nicotine or tobacco, such as cigarettes, e-cigarettes, and chewing tobacco. If you need help quitting, ask your health care provider.  Avoid caffeine, alcohol, and certain over-the-counter cold medicines. These may make you feel worse. Ask your pharmacist which medicines to avoid. General instructions  Take over-the-counter and prescription medicines only as told by your health care provider.  Keep all follow-up visits as told by your health care provider. This is important. Where to find support You can get help and support from these sources:  Self-help groups.  Online and [employer].  A trusted spiritual leader.  Couples counseling.  Family education classes.  Family therapy. Where to find more information You may find that joining a support group helps you deal with your anxiety. The following sources can help you locate counselors or support groups near you:  Mental Health America: www.mentalhealthamerica.net  Anxiety and Depression Association of  [place] (ADAA): ProgramCam.de  [employer] on Mental Illness (NAMI): www.nami.org Contact a health care provider if you:  Have a hard time staying focused or finishing daily tasks.  Spend many hours a day feeling worried about everyday life.  Become exhausted by  worry.  Start to have headaches, feel tense, or have nausea.  Urinate more than normal.  Have diarrhea. Get help right away if you have:  A racing heart and shortness of breath.  Thoughts of hurting yourself or others. If you ever feel like you may hurt yourself or others, or have thoughts about taking your own life, get help right away. You can go to your nearest emergency department or call:  Your local emergency services (911 in the [place]  A suicide crisis helpline, such as the National Suicide Prevention Lifeline at 740-364-2484. This is open 24 hours a day. Summary  Taking steps to learn and use tension reduction techniques can help calm you and help prevent triggering an anxiety reaction.  When used together, medicines, psychotherapy, and tension reduction techniques may be the most effective treatment.  Family, friends, and partners can play a big part in helping you recover from an anxiety disorder. This information is not intended to replace advice given to you by your health care provider. Make sure you discuss any questions you have with your health care provider. Document Revised: 12/19/2018 Document Reviewed: 12/19/2018 Elsevier Patient Education  2020 [employer].   Palpitations Palpitations are feelings that your heartbeat is not normal. Your heartbeat may feel like it is:  Uneven.  Faster than normal.  Fluttering.  Skipping a beat. This is usually not a serious problem. In some cases, you may need tests to rule out any serious problems. Follow these instructions at home: Pay attention to any changes in your condition. Take these actions to help manage your symptoms: Eating and drinking  Avoid: ? Coffee, tea, soft drinks, and energy drinks. ? Chocolate. ? Alcohol. ? Diet pills. Lifestyle   Try to lower your stress. These things can help you relax: ? Yoga. ? Deep breathing and meditation. ? Exercise. ? Using words and images to create  positive thoughts (guided imagery). ? Using your mind to control things in your body (biofeedback).  Do not use drugs.  Get plenty of rest and sleep. Keep a regular bed time. General instructions   Take over-the-counter and prescription medicines only as told by your doctor.  Do not use any products that contain nicotine or tobacco, such as cigarettes and e-cigarettes. If you need help quitting, ask your doctor.  Keep all follow-up visits as told by your doctor. This is important. You may need more tests if palpitations do not go away or get worse. Contact a doctor if:  Your symptoms last more than 24 hours.  Your symptoms occur more often. Get help right away if you:  Have chest pain.  Feel short of breath.  Have a very bad headache.  Feel dizzy.  Pass out (faint). Summary  Palpitations are feelings that your heartbeat is uneven or faster than normal. It may feel like your heart is fluttering or skipping a beat.  Avoid food and drinks that may cause palpitations. These include caffeine, chocolate, and alcohol.  Try to lower your stress. Do not smoke or use drugs.  Get help right away if you faint or have chest pain, shortness of breath, a severe headache, or dizziness. This information is not intended to replace advice given to you by your  health care provider. Make sure you discuss any questions you have with your health care provider. Document Revised: 08/31/2017 Document Reviewed: 08/31/2017 Elsevier Patient Education  2020 [employer].

## 2019-09-06 NOTE — Progress Notes (Signed)
Patient: Tara Foley Female    DOB: 1990/12/01   28 y.o.   MRN: [id number] Visit Date: 09/06/2019  Today's Provider: Jairo Ben, FNP   No chief complaint on file.  Subjective:     Palpitations  This is a new problem. The current episode started in the past 7 days (patient states that symptoms begain after she increased Paxil to half tablet daily, patient states prior to increasing dose she had had increased symptoms on anxiety). The problem occurs intermittently. The problem has been unchanged. The symptoms are aggravated by stress. Associated symptoms include anxiety. Pertinent negatives include no chest fullness, chest pain, coughing, diaphoresis, dizziness, fever, irregular heartbeat, malaise/fatigue, nausea, near-syncope, numbness, shortness of breath, syncope, vomiting or weakness. She has tried nothing for the symptoms. Her past medical history is significant for anxiety.  Denies any other associated symptoms with the palpitations. Negative for edema.  She has had no soda today, not drink coffee, denies caffeine intake and foods today.  She occasionally with dinner a Pepsi.  She picked up a prescription of Paxil and was given round pills and hers are usually oblong and she called pharmacy and pharmacy verified with her that it was the same medification it was just a different manufacturer, She reports she has been more stressed over the last few months.  She feels as though it is controlled.  She was taking 5 mg 1/4 tablet of 20 mg Paxil and recently stopped that and increased to 10 mg 1/2 tablet of her 20 mg tablet. Effexor did not work for her in the past she reports.  He has been taking Paxil for a while, she reports she had taken 10 mg in the past and started getting bad headaches with that.   Reports that she did not take her Paxil at all last night, and actually feels better this morning.  She  also says she has Klonopin on hand but does not take it.   Effexor in the past and she was not able to tolerate it.   She reports she has taken Zoloft in the past and did very well with it, she may want to consider going back on it.  She has no other cardiac symptoms.  Denies any associated dizziness lightheadedness syncope or presyncopal episodes.  No chest pain or chest tightness.  Has any back pain.  Shortness of breath or pleuritic chest pain.  She is on oral contraceptives.Patient's last menstrual period was 08/22/2018.  Denies any calf pain bilaterally or swelling.  No recent flights, no recent surgeries.  She has tachycardia in the office today, her heart rate decreases to 103 with a pulse ox on her finger while talking with provider.  EKG was heart rate of 120 sinus tachycardia. He has no other associated symptoms.  Pressure is mildly elevated 36/90.  118/79 heart rate 72 129/77 heart rate 80   She reports she had increased heart rate in the office only.   No Known Allergies   Current Outpatient Medications:  .  norethindrone-ethinyl estradiol (BLISOVI FE 1/20) 1-20 MG-MCG tablet, Take 1 tablet by mouth daily., Disp: 3 Package, Rfl: 0 .  PARoxetine (PAXIL) 20 MG tablet, Take 1 tablet (20 mg total) by mouth daily., Disp: 90 tablet, Rfl: 1  Review of Systems  Constitutional: Negative for diaphoresis, fever and malaise/fatigue.  Respiratory: Negative for cough and shortness of breath.   Cardiovascular: Positive for palpitations. Negative for chest pain, syncope and near-syncope.  Gastrointestinal: Negative for nausea and vomiting.  Neurological: Negative for dizziness, weakness and numbness.  Psychiatric/Behavioral: The patient is nervous/anxious.     Social History   Tobacco Use  . Smoking status: Never Smoker  . Smokeless tobacco: Never Used  Substance Use Topics  . Alcohol use: No      Objective:   Vitals with BMI 09/06/2019 10/09/2018 07/12/2018  Height - - 5\' 6"   Weight 186 lbs 10 oz 178 lbs 13 oz 191 lbs  BMI - - 78.46   Systolic 962 [id number] 841  Diastolic 90 99 84  Pulse 324 129 110   Physical Exam Vitals reviewed.  Constitutional:      General: She is not in acute distress.    Appearance: Normal appearance. She is not ill-appearing, toxic-appearing or diaphoretic.  HENT:     Head: Normocephalic and atraumatic.     Nose: Nose normal.     Mouth/Throat:     Mouth: Mucous membranes are moist.  Eyes:     Conjunctiva/sclera: Conjunctivae normal.     Pupils: Pupils are equal, round, and reactive to light.  Neck:     Thyroid: No thyroid mass, thyromegaly or thyroid tenderness.     Trachea: Trachea and phonation normal.     Meningeal: Brudzinski's sign and Kernig's sign absent.  Cardiovascular:     Rate and Rhythm: Regular rhythm. Tachycardia present.     Pulses: Normal pulses.     Heart sounds: Normal heart sounds. No murmur. No friction rub. No gallop.   Pulmonary:     Effort: Pulmonary effort is normal. No respiratory distress.     Breath sounds: Normal breath sounds. No stridor. No wheezing, rhonchi or rales.  Chest:     Chest wall: No tenderness.  Abdominal:     Palpations: Abdomen is soft.  Musculoskeletal:        General: Normal range of motion.     Cervical back: Full passive range of motion without pain and normal range of motion.  Lymphadenopathy:     Cervical: No cervical adenopathy.     Right cervical: No superficial, deep or posterior cervical adenopathy.    Left cervical: No superficial, deep or posterior cervical adenopathy.  Skin:    General: Skin is warm and dry.     Capillary Refill: Capillary refill takes less than 2 seconds.  Neurological:     General: No focal deficit present.     Mental Status: She is alert and oriented to person, place, and time.     Gait: Gait normal.     Deep Tendon Reflexes: Reflexes normal.  Psychiatric:        Mood and Affect: Mood normal.        Behavior: Behavior normal.        Thought Content: Thought content normal.        Judgment: Judgment  normal.      No results found for any visits on 09/06/19.     Assessment & Plan     Palpitations - Plan: EKG 12-Lead, TSH, CBC with Differential/Platelet, Comprehensive Metabolic Panel (CMET), Lipid Panel w/o Chol/HDL Ratio  Anxiety   EKG shows sinus tachycardia, heart rate reached while talking to provider with pulse ox on her finger.  She reports she always gets nervous when she comes to the doctor.  She has readings on her phone with pictures of the blood pressure machine she is using at home, she also compared it to her mother's blood pressure machine and the readings  were consistent.  She has no other associated symptoms with her palpitations. She may want to possibly consider taking Zoloft again per her discretion.  We will discuss this at the next visit.  She  was given red flags palpitations and when to seek emergency attention immediately. She will possibly take a fourth of her Paxil again for a few nights and then discontinue, she did not take it last night and actually feels better so it is recommended that she can discontinue it and we will follow-up at her next visit in 1 week to discuss other options and do a GAD 6.  She also has not had her TSH checked only, will check labs today as she is fasting.    Also given counseling resources for anxiety, recommend counseling as well. Return in about 1 week (around 09/13/2019), or if symptoms worsen or fail to improve, for at any time for any worsening symptoms, Go to Emergency room/ urgent care if worse.    The entirety of the information documented in the History of Present Illness, Review of Systems and Physical Exam were personally obtained by me. Portions of this information were initially documented by the  Certified Medical Assistant whose name is documented in Epic and reviewed by me for thoroughness and accuracy.  I have personally performed the exam and reviewed the chart and it is accurate to the best of my knowledge.  Dentist has been used and any errors in dictation or transcription are unintentional.  Eula Fried. Flinchum FNP-C  [facility]   Jairo Ben, FNP  [facility]

## 2019-09-07 LAB — COMPREHENSIVE METABOLIC PANEL
ALT: 29 IU/L (ref 0–32)
AST: 32 IU/L (ref 0–40)
Albumin/Globulin Ratio: 1.5 (ref 1.2–2.2)
Albumin: 4.5 g/dL (ref 3.9–5.0)
Alkaline Phosphatase: 69 IU/L (ref 39–117)
BUN/Creatinine Ratio: 10 (ref 9–23)
BUN: 9 mg/dL (ref 6–20)
Bilirubin Total: 0.3 mg/dL (ref 0.0–1.2)
CO2: 17 mmol/L — ABNORMAL LOW (ref 20–29)
Calcium: 9.9 mg/dL (ref 8.7–10.2)
Chloride: 105 mmol/L (ref 96–106)
Creatinine, Ser: 0.89 mg/dL (ref 0.57–1.00)
GFR calc Af Amer: 101 mL/min/{1.73_m2} (ref >59)
GFR calc non Af Amer: 87 mL/min/{1.73_m2} (ref >59)
Globulin, Total: 3.1 g/dL (ref 1.5–4.5)
Glucose: 88 mg/dL (ref 65–99)
Potassium: 3.9 mmol/L (ref 3.5–5.2)
Sodium: 142 mmol/L (ref 134–144)
Total Protein: 7.6 g/dL (ref 6.0–8.5)

## 2019-09-07 LAB — CBC WITH DIFFERENTIAL/PLATELET
Basophils Absolute: 0 10*3/uL (ref 0.0–0.2)
Basos: 1 %
EOS (ABSOLUTE): 0.1 10*3/uL (ref 0.0–0.4)
Eos: 1 %
Hematocrit: 41.9 % (ref 34.0–46.6)
Hemoglobin: 14.6 g/dL (ref 11.1–15.9)
Immature Grans (Abs): 0 10*3/uL (ref 0.0–0.1)
Immature Granulocytes: 0 %
Lymphocytes Absolute: 2 10*3/uL (ref 0.7–3.1)
Lymphs: 25 %
MCH: 30 pg (ref 26.6–33.0)
MCHC: 34.8 g/dL (ref 31.5–35.7)
MCV: 86 fL (ref 79–97)
Monocytes Absolute: 0.4 10*3/uL (ref 0.1–0.9)
Monocytes: 5 %
Neutrophils Absolute: 5.5 10*3/uL (ref 1.4–7.0)
Neutrophils: 68 %
Platelets: 296 10*3/uL (ref 150–450)
RBC: 4.87 x10E6/uL (ref 3.77–5.28)
RDW: 12.3 % (ref 11.7–15.4)
WBC: 8 10*3/uL (ref 3.4–10.8)

## 2019-09-07 LAB — TSH: TSH: 2.04 u[IU]/mL (ref 0.450–4.500)

## 2019-09-07 LAB — LIPID PANEL W/O CHOL/HDL RATIO
Cholesterol, Total: 243 mg/dL — ABNORMAL HIGH (ref 100–199)
HDL: 36 mg/dL — ABNORMAL LOW (ref >39)
LDL Chol Calc (NIH): 167 mg/dL — ABNORMAL HIGH (ref 0–99)
Triglycerides: 216 mg/dL — ABNORMAL HIGH (ref 0–149)
VLDL Cholesterol Cal: 40 mg/dL (ref 5–40)

## 2019-09-07 NOTE — Progress Notes (Signed)
CBC is within normal no signs of anemia or infection.  CMP is within  normal range with kidney and liver function within range. Electrolytes normal.   Total cholesterol and LDL elevated.  Discuss lifestyle modification with patient e.g. increase exercise, fiber, fruits, vegetables, lean meat, and omega 3/fish intake and decrease saturated fat.  If patient following strict diet and exercise program already please schedule follow up appointment with primary care physician. Triglycerides are also elevated. This appears to be consistent for the past three years. You should work really hard on increasing activity and exercise daily as well as watching your diet. If you are doing those things medications may need to be considered.

## 2019-09-13 ENCOUNTER — Ambulatory Visit: Payer: Managed Care, Other (non HMO) | Admitting: Adult Health

## 2019-09-13 ENCOUNTER — Encounter: Payer: Self-pay | Admitting: Adult Health

## 2019-09-13 ENCOUNTER — Other Ambulatory Visit: Payer: Self-pay

## 2019-09-13 VITALS — BP 122/82 | HR 112 | Temp 96.6°F | Resp 16 | Wt 188.0 lb

## 2019-09-13 DIAGNOSIS — E559 Vitamin D deficiency, unspecified: Secondary | ICD-10-CM | POA: Diagnosis not present

## 2019-09-13 DIAGNOSIS — E785 Hyperlipidemia, unspecified: Secondary | ICD-10-CM

## 2019-09-13 DIAGNOSIS — F418 Other specified anxiety disorders: Secondary | ICD-10-CM

## 2019-09-13 DIAGNOSIS — R69 Illness, unspecified: Secondary | ICD-10-CM | POA: Diagnosis not present

## 2019-09-13 MED ORDER — HYDROXYZINE HCL 25 MG PO TABS: 25.0000 mg | ORAL_TABLET | Freq: Three times a day (TID) | ORAL | 0 refills | Status: DC | PRN

## 2019-09-13 NOTE — Progress Notes (Signed)
Patient: Tara Foley Female    DOB: 08/12/1990   29 y.o.   MRN: [id number] Visit Date: 09/13/2019  Today's Provider: Jairo Ben, FNP   Chief Complaint  Patient presents with  . Anxiety   Subjective:     HPI  Follow up for Palpitations & Anxiety  The patient was last seen for this 1 weeks ago. Changes made at last visit include patient was advised to keep readings at home of her blood pressure and heart rate, patient reports that she has not had any more episodes of palpitations or panic since visit. Patient reports that systolic readings at home have ranged from 115-125 and diastolic readings 80-85, patient reports that heart rate has been between 73-82. At last visit we discontinued patient on Paxil 20mg , patient states since visit she has not experienced any symptoms of withdrawal and states that her symptoms with anxiety seem to have improved since stopping medication. Denies any palpations since stopping Paxil she reports seemed to have stopped.   She is sleeping better since stopping her medication- Paxil.  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   Denies any chest pain.   She reports she feels much better off the Paxil.   She is on OCP, and wonders if these could have some effect on her anxiety - she feels this caused the issues. Her last Pap smear was normal. Her husband is having a vasectomy in March so she thinks she will just stay on the medication until he is cleared.      Office Visit from 09/13/2019 in [facility]  PHQ-9 Total Score  0     GAD 7 : Generalized Anxiety Score 09/13/2019  Nervous, Anxious, on Edge 1  Control/stop worrying 0  Worry too much - different things 1  Trouble relaxing 0  Restless 0  Easily annoyed or irritable 0  Afraid - awful might happen 1  Total GAD 7 Score 3    Anxiety Difficulty Not difficult at all     ------------------------------------------------------------------------------------  No Known Allergies   Current Outpatient Medications:  .  norethindrone-ethinyl estradiol (BLISOVI FE 1/20) 1-20 MG-MCG tablet, Take 1 tablet by mouth daily., Disp: 3 Package, Rfl: 0 .  Ascorbic Acid (VITAMIN C PO), Take by mouth., Disp: , Rfl:  .  Cholecalciferol (VITAMIN D) 50 MCG (2000 UT) tablet, Take 2,000 Units by mouth daily., Disp: , Rfl:  .  hydrOXYzine (ATARAX/VISTARIL) 25 MG tablet, Take 1 tablet (25 mg total) by mouth every 8 (eight) hours as needed for anxiety (may cause drowsiness)., Disp: 30 tablet, Rfl: 0 .  Zinc Sulfate (ZINC 15 PO), Take by mouth., Disp: , Rfl:   Review of Systems  Constitutional: Negative.   HENT: Negative.   Eyes: Negative.   Respiratory: Negative.   Cardiovascular: Positive for palpitations (much improved since stopping Paxil ). Negative for chest pain and leg swelling.  Gastrointestinal: Negative.   Genitourinary: Negative.   Musculoskeletal: Negative.   Skin: Negative.   Neurological: Negative.   Hematological: Negative.   Psychiatric/Behavioral: Negative for agitation, behavioral problems, confusion, decreased concentration, dysphoric mood, hallucinations, self-injury, sleep disturbance and suicidal ideas. The patient is nervous/anxious (seems to be only situational ). The patient is not hyperactive.     Social History   Tobacco Use  . Smoking  status: Never Smoker  . Smokeless tobacco: Never Used  Substance Use Topics  . Alcohol use: No      Objective:   BP 122/82   Pulse (!) 112   Temp (!) 96.6 F (35.9 C) (Oral)   Resp 16   Wt 188 lb (85.3 kg)   SpO2 99%   BMI 30.34 kg/m  Vitals:   09/13/19 1330  BP: 122/82  Pulse: (!) 112  Resp: 16  Temp: (!) 96.6 F (35.9 C)  TempSrc: Oral  SpO2: 99%  Weight: 188 lb (85.3 kg)  Body mass index is 30.34 kg/m.   Physical Exam Vitals reviewed.   Constitutional:      General: She is not in acute distress.    Appearance: Normal appearance. She is not ill-appearing, toxic-appearing or diaphoretic.     Comments: Patient is alert and oriented and responsive to questions Engages in eye contact with provider. Speaks in full sentences without any pauses without any shortness of breath or distress.    HENT:     Head: Normocephalic and atraumatic.     Nose: Nose normal. No congestion or rhinorrhea.     Mouth/Throat:     Mouth: Mucous membranes are moist.  Eyes:     General: No scleral icterus.       Right eye: No discharge.        Left eye: No discharge.     Extraocular Movements: Extraocular movements intact.     Conjunctiva/sclera: Conjunctivae normal.     Pupils: Pupils are equal, round, and reactive to light.  Cardiovascular:     Rate and Rhythm: Normal rate and regular rhythm.  Pulmonary:     Effort: Pulmonary effort is normal.     Breath sounds: Normal breath sounds.  Abdominal:     Palpations: Abdomen is soft.  Musculoskeletal:        General: Normal range of motion.     Cervical back: Normal range of motion and neck supple.  Skin:    General: Skin is warm and dry.     Capillary Refill: Capillary refill takes less than 2 seconds.  Neurological:     General: No focal deficit present.     Mental Status: She is alert and oriented to person, place, and time.  Psychiatric:        Mood and Affect: Mood normal.        Behavior: Behavior normal.        Thought Content: Thought content normal.        Judgment: Judgment normal.    Recent Results (from the past 2160 hour(s))  CBC with Differential/Platelet     Status: None   Collection Time: 09/06/19 12:10 PM  Result Value Ref Range   WBC 8.0 3.4 - 10.8 x10E3/uL   RBC 4.87 3.77 - 5.28 x10E6/uL   Hemoglobin 14.6 11.1 - 15.9 g/dL   Hematocrit 41.9 34.0 - 46.6 %   MCV 86 79 - 97 fL   MCH 30.0 26.6 - 33.0 pg   MCHC 34.8 31.5 - 35.7 g/dL   RDW 12.3 11.7 - 15.4 %    Platelets 296 150 - 450 x10E3/uL   Neutrophils 68 Not Estab. %   Lymphs 25 Not Estab. %   Monocytes 5 Not Estab. %   Eos 1 Not Estab. %   Basos 1 Not Estab. %   Neutrophils Absolute 5.5 1.4 - 7.0 x10E3/uL   Lymphocytes Absolute 2.0 0.7 - 3.1 x10E3/uL   Monocytes Absolute 0.4  0.1 - 0.9 x10E3/uL   EOS (ABSOLUTE) 0.1 0.0 - 0.4 x10E3/uL   Basophils Absolute 0.0 0.0 - 0.2 x10E3/uL   Immature Granulocytes 0 Not Estab. %   Immature Grans (Abs) 0.0 0.0 - 0.1 x10E3/uL  Comprehensive metabolic panel     Status: Abnormal   Collection Time: 09/06/19 12:10 PM  Result Value Ref Range   Glucose 88 65 - 99 mg/dL   BUN 9 6 - 20 mg/dL   Creatinine, Ser 1.61 0.57 - 1.00 mg/dL   GFR calc non Af Amer 87 >59 mL/min/1.73   GFR calc Af Amer 101 >59 mL/min/1.73   BUN/Creatinine Ratio 10 9 - 23   Sodium 142 134 - 144 mmol/L   Potassium 3.9 3.5 - 5.2 mmol/L   Chloride 105 96 - 106 mmol/L   CO2 17 (L) 20 - 29 mmol/L   Calcium 9.9 8.7 - 10.2 mg/dL   Total Protein 7.6 6.0 - 8.5 g/dL   Albumin 4.5 3.9 - 5.0 g/dL   Globulin, Total 3.1 1.5 - 4.5 g/dL   Albumin/Globulin Ratio 1.5 1.2 - 2.2   Bilirubin Total 0.3 0.0 - 1.2 mg/dL   Alkaline Phosphatase 69 39 - 117 IU/L   AST 32 0 - 40 IU/L   ALT 29 0 - 32 IU/L  Lipid Panel w/o Chol/HDL Ratio     Status: Abnormal   Collection Time: 09/06/19 12:10 PM  Result Value Ref Range   Cholesterol, Total 243 (H) 100 - 199 mg/dL   Triglycerides 096 (H) 0 - 149 mg/dL   HDL 36 (L) >04 mg/dL   VLDL Cholesterol Cal 40 5 - 40 mg/dL   LDL Chol Calc (NIH) 540 (H) 0 - 99 mg/dL  TSH     Status: None   Collection Time: 09/06/19 12:10 PM  Result Value Ref Range   TSH 2.040 0.450 - 4.500 uIU/mL    No results found for any visits on 09/13/19.     Assessment & Plan     1. Situational anxiety Meds ordered this encounter  Medications  . hydrOXYzine (ATARAX/VISTARIL) 25 MG tablet    Sig: Take 1 tablet (25 mg total) by mouth every 8 (eight) hours as needed for anxiety (may  cause drowsiness).    Dispense:  30 tablet    Refill:  0   She will try medication as above. She is aware may cause drowsiness and to use with caution.   She is feeling much better off Paxil , sleeping well. Feels less stressed.   Medications Discontinued During This Encounter  Medication Reason  . PARoxetine (PAXIL) 20 MG tablet Completed Course   Discussed Wellbutrin as treatment if needed, her PHQ score and GAD score are really ok. So she will see how she does on the HydOXYzine as directed as needed.   2. Vitamin D deficiency Taking Vitamin D over the counter.  - VITAMIN D 25 Hydroxy (Vit-D Deficiency, Fractures)  3. Hyperlipidemia-  Diet, exercise lifestyle changes.   Advised patient call the office or your primary care doctor for an appointment if no improvement within 72 hours or if any symptoms change or worsen at any time  Advised ER or urgent Care if after hours or on weekend. Call 911 for emergency symptoms at any time.Patinet verbalized understanding of all instructions given/reviewed and treatment plan and has no further questions or concerns at this time.      Return in about 1 month (around 10/11/2019), or if symptoms worsen or fail to  improve, for at any time for any worsening symptoms, Go to Emergency room/ urgent care if worse.    Addressed extensive list of chronic and acute medical problems today requiring 30 minutes reviewing her medical record, counseling patient regarding her conditions and coordination of care.    The entirety of the information documented in the History of Present Illness, Review of Systems and Physical Exam were personally obtained by me. Portions of this information were initially documented by the  Certified Medical Assistant whose name is documented in Epic and reviewed by me for thoroughness and accuracy.  I have personally performed the exam and reviewed the chart and it is accurate to the best of my knowledge.  Museum/gallery conservator has been  used and any errors in dictation or transcription are unintentional.  Eula Fried. Flinchum FNP-C  [facility]   Jairo Ben, FNP  [facility]

## 2019-09-13 NOTE — Patient Instructions (Addendum)
Meds ordered this encounter  Medications  . hydrOXYzine (ATARAX/VISTARIL) 25 MG tablet    Sig: Take 1 tablet (25 mg total) by mouth every 8 (eight) hours as needed for anxiety (may cause drowsiness).    Dispense:  30 tablet    Refill:  0   Orders Placed This Encounter  Procedures  . VITAMIN D 25 Hydroxy (Vit-D Deficiency, Fractures)     Social Anxiety Disorder, Adult Social anxiety disorder (SAD), previously called social phobia, is a mental health condition. People with SAD often feel nervous, afraid, or embarrassed when they are around other people in social situations. They worry that other people are judging or criticizing them for how they look, what they say, or how they act. SAD involves more than just feeling shy or self-conscious at times. It can cause severe emotional distress. It can interfere with activities of daily life. SAD also may lead to alcohol or drug use, and even suicide. SAD is a common mental health condition. It can develop at any time, but it usually starts in the teenage years. What are the causes? The cause of this condition is not known. It may involve genes that are passed through families. Stressful events may trigger anxiety. This disorder is also associated with an overactive amygdala. The amygdala is the part of the brain that triggers your response to strong feelings, such as fear. What increases the risk? This condition is more likely to develop in:  People who have a family history of anxiety disorders.  Women.  People who have a physical or behavioral condition that makes them feel self-conscious or nervous, such as a stutter or a long-term (chronic) disease. What are the signs or symptoms? The main symptom of this condition is fear of embarrassment caused by being criticized or judged in social situations. You may be afraid to:  Speak in public.  Go shopping.  Use a public bathroom.  Eat at a restaurant.  Go to work.  Interact with people  you do not know. Extreme fear and anxiety may cause physical symptoms, including:  Blushing.  A fast heartbeat.  Sweating.  Shaky hands or voice.  Confusion.  Light-headedness.  Upset stomach, diarrhea, or vomiting.  Shortness of breath. How is this diagnosed? This condition is diagnosed based on your history, symptoms, and behavior in social situations. You may be diagnosed with this type of anxiety if your symptoms have lasted for more than 6 months and have been present on more days than not. Your health care provider may ask you about your use of alcohol, drugs, and prescription medicines. He or she may also refer you to a mental health specialist for further evaluation or treatment. How is this treated? Treatment for this condition may include:  Cognitive behavioral therapy (CBT). This type of talk therapy helps you learn to replace negative thoughts and behaviors with positive ones. This may include learning how to use self-calming skills and other methods of managing your anxiety.  Exposure therapy. You will be exposed to social situations that cause you fear. The treatment starts with practicing self-calming in situations that cause you low levels of fear. Over time, you will progress by sustaining self-calming and managing harder situations.  Antidepressant medicines. These medicines may be used by themselves or in addition to other therapies.  Biofeedback. This process trains you to manage your body's response (physiological response) through breathing techniques and relaxation methods. You will work with a therapist while machines are used to monitor your physical symptoms.  Techniques for relaxation and managing anxiety. These include deep breathing, self-talk, meditation, visual imagery, muscle relaxation, music therapy, and yoga. These techniques are often used with other therapies to keep you calm in situations that cause you anxiety. These treatments are often used in  combination. Follow these instructions at home: Alcohol use If you drink alcohol:  Limit how much you use to: ? 0-1 drink a day for nonpregnant women. ? 0-2 drinks a day for men.  Be aware of how much alcohol is in your drink. In the [place] one drink equals one 12 oz bottle of beer (355 mL), one 5 oz glass of wine (148 mL), or one 1 oz glass of hard liquor (44 mL). General instructions  Take over-the-counter and prescription medicines only as told by your health care provider.  Practice techniques for relaxation and managing anxiety at times you are not challenged by social anxiety.  Return to social activities using techniques you have learned, as you feel ready to do so.  Avoid caffeine and certain over-the-counter cold medicines. These may make you feel worse. Ask your pharmacists which medicines to avoid.  Keep all follow-up visits as told by your health care provider. This is important. Where to find more information  The [employer] Mental [employer]ness (NAMI): https://www.nami.org  Social Anxiety Association: https://socialphobia.org  Mental Health America [facility]): https://www.stevens-henderson.com/  Anxiety and Depression Association of [place] (ADAA): http://miller-hamilton.net/ Contact a health care provider if:  Your symptoms do not improve or get worse.  You have signs of depression, such as: ? Persistent sadness or moodiness. ? Loss of enjoyment in activities that used to bring you joy. ? Change in weight or eating. ? Changes in sleeping habits. ? Avoiding friends or family members more than usual. ? Loss of energy for normal tasks. ? Feeling guilty or worthless.  You become more isolated than you normally are.  You find it more and more difficult to speak or interact with others.  You are using drugs.  You are drinking more alcohol than usual. Get help right away if:  You harm yourself.  You have suicidal thoughts. If you ever feel like you may hurt yourself or  others, or have thoughts about taking your own life, get help right away. You can go to your nearest emergency department or call:  Your local emergency services (911 in the [place]  A suicide crisis helpline, such as the National Suicide Prevention Lifeline at 513-210-8062. This is open 24 hours a day. Summary  Social anxiety disorder (SAD) may cause you to feel nervous, afraid, or embarrassed when you are around other people in social situations.  SAD is a common mental disorder. It can develop at any time, but it usually starts in the teenage years.  Treatment includes talk therapy, exposure therapy, medicines, biofeedback, and relaxation techniques. It can involve a combination of treatments. This information is not intended to replace advice given to you by your health care provider. Make sure you discuss any questions you have with your health care provider. Document Revised: 12/20/2018 Document Reviewed: 12/20/2018 Elsevier Patient Education  2020 Elsevier Inc. Hydroxyzine capsules or tablets What is this medicine? HYDROXYZINE (hye DROX i zeen) is an antihistamine. This medicine is used to treat allergy symptoms. It is also used to treat anxiety and tension. This medicine can be used with other medicines to induce sleep before surgery. This medicine may be used for other purposes; ask your health care provider or pharmacist if you have questions. COMMON BRAND  NAME(S): ANX, Atarax, Rezine, Vistaril What should I tell my health care provider before I take this medicine? They need to know if you have any of these conditions:  glaucoma  heart disease  history of irregular heartbeat  kidney disease  liver disease  lung or breathing disease, like asthma  stomach or intestine problems  thyroid disease  trouble passing urine  an unusual or allergic reaction to hydroxyzine, cetirizine, other medicines, foods, dyes or preservatives  pregnant or trying to get  pregnant  breast-feeding How should I use this medicine? Take this medicine by mouth with a full glass of water. Follow the directions on the prescription label. You may take this medicine with food or on an empty stomach. Take your medicine at regular intervals. Do not take your medicine more often than directed. Talk to your pediatrician regarding the use of this medicine in children. Special care may be needed. While this drug may be prescribed for children as young as 65 years of age for selected conditions, precautions do apply. Patients over 33 years old may have a stronger reaction and need a smaller dose. Overdosage: If you think you have taken too much of this medicine contact a poison control center or emergency room at once. NOTE: This medicine is only for you. Do not share this medicine with others. What if I miss a dose? If you miss a dose, take it as soon as you can. If it is almost time for your next dose, take only that dose. Do not take double or extra doses. What may interact with this medicine? Do not take this medicine with any of the following medications:  cisapride  dronedarone  pimozide  thioridazine This medicine may also interact with the following medications:  alcohol  antihistamines for allergy, cough, and cold  atropine  barbiturate medicines for sleep or seizures, like phenobarbital  certain antibiotics like erythromycin or clarithromycin  certain medicines for anxiety or sleep  certain medicines for bladder problems like oxybutynin, tolterodine  certain medicines for depression or psychotic disturbances  certain medicines for irregular heart beat  certain medicines for Parkinson's disease like benztropine, trihexyphenidyl  certain medicines for seizures like phenobarbital, primidone  certain medicines for stomach problems like dicyclomine, hyoscyamine  certain medicines for travel sickness like scopolamine  ipratropium  narcotic  medicines for pain  other medicines that prolong the QT interval (an abnormal heart rhythm) like dofetilide This list may not describe all possible interactions. Give your health care provider a list of all the medicines, herbs, non-prescription drugs, or dietary supplements you use. Also tell them if you smoke, drink alcohol, or use illegal drugs. Some items may interact with your medicine. What should I watch for while using this medicine? Tell your doctor or health care professional if your symptoms do not improve. You may get drowsy or dizzy. Do not drive, use machinery, or do anything that needs mental alertness until you know how this medicine affects you. Do not stand or sit up quickly, especially if you are an older patient. This reduces the risk of dizzy or fainting spells. Alcohol may interfere with the effect of this medicine. Avoid alcoholic drinks. Your mouth may get dry. Chewing sugarless gum or sucking hard candy, and drinking plenty of water may help. Contact your doctor if the problem does not go away or is severe. This medicine may cause dry eyes and blurred vision. If you wear contact lenses you may feel some discomfort. Lubricating drops may help. See  your eye doctor if the problem does not go away or is severe. If you are receiving skin tests for allergies, tell your doctor you are using this medicine. What side effects may I notice from receiving this medicine? Side effects that you should report to your doctor or health care professional as soon as possible:  allergic reactions like skin rash, itching or hives, swelling of the face, lips, or tongue  changes in vision  confusion  fast, irregular heartbeat  seizures  tremor  trouble passing urine or change in the amount of urine Side effects that usually do not require medical attention (report to your doctor or health care professional if they continue or are bothersome):  constipation  drowsiness  dry  mouth  headache  tiredness This list may not describe all possible side effects. Call your doctor for medical advice about side effects. You may report side effects to FDA at 1-800-FDA-1088. Where should I keep my medicine? Keep out of the reach of children. Store at room temperature between 15 and 30 degrees C (59 and 86 degrees F). Keep container tightly closed. Throw away any unused medicine after the expiration date. NOTE: This sheet is a summary. It may not cover all possible information. If you have questions about this medicine, talk to your doctor, pharmacist, or health care provider.  2020 Elsevier/Gold Standard (2018-07-10 13:19:55) Wellbutrin XR information if you decide you want to try.  Bupropion extended-release tablets (Depression/Mood Disorders) What is this medicine? BUPROPION (byoo PROE pee on) is used to treat depression. This medicine may be used for other purposes; ask your health care provider or pharmacist if you have questions. COMMON BRAND NAME(S): Aplenzin, Budeprion XL, Forfivo XL, Wellbutrin XL What should I tell my health care provider before I take this medicine? They need to know if you have any of these conditions:  an eating disorder, such as anorexia or bulimia  bipolar disorder or psychosis  diabetes or high blood sugar, treated with medication  glaucoma  head injury or brain tumor  heart disease, previous heart attack, or irregular heart beat  high blood pressure  kidney or liver disease  seizures (convulsions)  suicidal thoughts or a previous suicide attempt  Tourette's syndrome  weight loss  an unusual or allergic reaction to bupropion, other medicines, foods, dyes, or preservatives  breast-feeding  pregnant or trying to become pregnant How should I use this medicine? Take this medicine by mouth with a glass of water. Follow the directions on the prescription label. You can take it with or without food. If it upsets your  stomach, take it with food. Do not crush, chew, or cut these tablets. This medicine is taken once daily at the same time each day. Do not take your medicine more often than directed. Do not stop taking this medicine suddenly except upon the advice of your doctor. Stopping this medicine too quickly may cause serious side effects or your condition may worsen. A special MedGuide will be given to you by the pharmacist with each prescription and refill. Be sure to read this information carefully each time. Talk to your pediatrician regarding the use of this medicine in children. Special care may be needed. Overdosage: If you think you have taken too much of this medicine contact a poison control center or emergency room at once. NOTE: This medicine is only for you. Do not share this medicine with others. What if I miss a dose? If you miss a dose, skip the missed dose  and take your next tablet at the regular time. Do not take double or extra doses. What may interact with this medicine? Do not take this medicine with any of the following medications:  linezolid  MAOIs like Azilect, Carbex, Eldepryl, Marplan, Nardil, and Parnate  methylene blue (injected into a vein)  other medicines that contain bupropion like Zyban This medicine may also interact with the following medications:  alcohol  certain medicines for anxiety or sleep  certain medicines for blood pressure like metoprolol, propranolol  certain medicines for depression or psychotic disturbances  certain medicines for HIV or AIDS like efavirenz, lopinavir, nelfinavir, ritonavir  certain medicines for irregular heart beat like propafenone, flecainide  certain medicines for Parkinson's disease like amantadine, levodopa  certain medicines for seizures like carbamazepine, phenytoin, phenobarbital  cimetidine  clopidogrel  cyclophosphamide  digoxin  furazolidone  isoniazid  nicotine  orphenadrine  procarbazine  steroid  medicines like prednisone or cortisone  stimulant medicines for attention disorders, weight loss, or to stay awake  tamoxifen  theophylline  thiotepa  ticlopidine  tramadol  warfarin This list may not describe all possible interactions. Give your health care provider a list of all the medicines, herbs, non-prescription drugs, or dietary supplements you use. Also tell them if you smoke, drink alcohol, or use illegal drugs. Some items may interact with your medicine. What should I watch for while using this medicine? Tell your doctor if your symptoms do not get better or if they get worse. Visit your doctor or healthcare provider for regular checks on your progress. Because it may take several weeks to see the full effects of this medicine, it is important to continue your treatment as prescribed by your doctor. This medicine may cause serious skin reactions. They can happen weeks to months after starting the medicine. Contact your healthcare provider right away if you notice fevers or flu-like symptoms with a rash. The rash may be red or purple and then turn into blisters or peeling of the skin. Or, you might notice a red rash with swelling of the face, lips or lymph nodes in your neck or under your arms. Patients and their families should watch out for new or worsening thoughts of suicide or depression. Also watch out for sudden changes in feelings such as feeling anxious, agitated, panicky, irritable, hostile, aggressive, impulsive, severely restless, overly excited and hyperactive, or not being able to sleep. If this happens, especially at the beginning of treatment or after a change in dose, call your healthcare provider. Avoid alcoholic drinks while taking this medicine. Drinking large amounts of alcoholic beverages, using sleeping or anxiety medicines, or quickly stopping the use of these agents while taking this medicine may increase your risk for a seizure. Do not drive or use heavy  machinery until you know how this medicine affects you. This medicine can impair your ability to perform these tasks. Do not take this medicine close to bedtime. It may prevent you from sleeping. Your mouth may get dry. Chewing sugarless gum or sucking hard candy, and drinking plenty of water may help. Contact your doctor if the problem does not go away or is severe. The tablet shell for some brands of this medicine does not dissolve. This is normal. The tablet shell may appear whole in the stool. This is not a cause for concern. What side effects may I notice from receiving this medicine? Side effects that you should report to your doctor or health care professional as soon as possible:  allergic  reactions like skin rash, itching or hives, swelling of the face, lips, or tongue  breathing problems  changes in vision  confusion  elevated mood, decreased need for sleep, racing thoughts, impulsive behavior  fast or irregular heartbeat  hallucinations, loss of contact with reality  increased blood pressure  rash, fever, and swollen lymph nodes  redness, blistering, peeling or loosening of the skin, including inside the mouth  seizures  suicidal thoughts or other mood changes  unusually weak or tired  vomiting Side effects that usually do not require medical attention (report to your doctor or health care professional if they continue or are bothersome):  constipation  headache  loss of appetite  nausea  tremors  weight loss This list may not describe all possible side effects. Call your doctor for medical advice about side effects. You may report side effects to FDA at 1-800-FDA-1088. Where should I keep my medicine? Keep out of the reach of children. Store at room temperature between 15 and 30 degrees C (59 and 86 degrees F). Throw away any unused medicine after the expiration date. NOTE: This sheet is a summary. It may not cover all possible information. If you have  questions about this medicine, talk to your doctor, pharmacist, or health care provider.  2020 Elsevier/Gold Standard (2018-10-12 13:45:31)

## 2019-09-14 LAB — VITAMIN D 25 HYDROXY (VIT D DEFICIENCY, FRACTURES): Vit D, 25-Hydroxy: 51.7 ng/mL (ref 30.0–100.0)

## 2019-09-14 NOTE — Progress Notes (Signed)
Vitamin D is within normal range. I recommend continuing Vitamin D 3 at four-thousand international units daily once by mouth. Recheck 6 months.

## 2019-09-24 ENCOUNTER — Other Ambulatory Visit: Payer: Self-pay | Admitting: Obstetrics and Gynecology

## 2019-09-24 DIAGNOSIS — Z3041 Encounter for surveillance of contraceptive pills: Secondary | ICD-10-CM

## 2019-10-09 ENCOUNTER — Ambulatory Visit: Payer: BLUE CROSS/BLUE SHIELD | Admitting: Obstetrics and Gynecology

## 2019-10-16 ENCOUNTER — Encounter: Payer: Self-pay | Admitting: Adult Health

## 2019-10-17 ENCOUNTER — Other Ambulatory Visit: Payer: Self-pay | Admitting: Adult Health

## 2019-10-17 MED ORDER — BUPROPION HCL ER (XL) 150 MG PO TB24: 150.0000 mg | ORAL_TABLET | Freq: Every day | ORAL | 0 refills | Status: DC

## 2019-10-26 ENCOUNTER — Ambulatory Visit (INDEPENDENT_AMBULATORY_CARE_PROVIDER_SITE_OTHER): Payer: 59 | Admitting: Adult Health

## 2019-10-26 ENCOUNTER — Encounter: Payer: Self-pay | Admitting: Adult Health

## 2019-10-26 ENCOUNTER — Other Ambulatory Visit: Payer: Self-pay

## 2019-10-26 VITALS — BP 120/84 | HR 107 | Temp 97.3°F | Resp 16 | Wt 176.8 lb

## 2019-10-26 DIAGNOSIS — R69 Illness, unspecified: Secondary | ICD-10-CM | POA: Diagnosis not present

## 2019-10-26 DIAGNOSIS — F419 Anxiety disorder, unspecified: Secondary | ICD-10-CM

## 2019-10-26 NOTE — Progress Notes (Signed)
Patient: Tara Foley Female    DOB: 1991/04/06   29 y.o.   MRN: [id number] Visit Date: 10/26/2019  Today's Provider: Jairo Ben, FNP   Chief Complaint  Patient presents with  . Anxiety   Subjective:     Anxiety Presents for follow-up (s) visit. Symptoms include dry mouth, excessive worry, nervous/anxious behavior, palpitations, panic and restlessness. Patient reports no chest pain, compulsions, confusion, decreased concentration, depressed mood, dizziness, feeling of choking, hyperventilation, impotence, insomnia, irritability, malaise, nausea, obsessions, shortness of breath or suicidal ideas. Primary symptoms comment: at last visit patient was discontinued off Paxil and started on Hydroxyzine as welll as Wellbutrin. . The quality of sleep is poor. Nighttime awakenings: one to two.   Side effects of treatment include joint pain.     She reports she has been on Paxil and  she took  1/4 of tablet then increased to the 1/2 tablet and had these same symptoms but before she has had these same symptoms and she wanted to come off of it.    She  started Wellbutrin this week 3/23 and is taking 150 mg XL.  She reports it makes it sleep during the day so she plans on to start taking it tonight..   She says she notices that even just after taking her Wellbutrin for couple days, that she does feel count at home or in the car and has been able to focus on doing things at night that she was unable to do before.  He is questioning whether or not she should stay on Wellbutrin or switch to another antidepressant medication.  She reports she worries about things like as this medication can cause her to have side effects, if she cannot have a panic attack when she goes to work or is in her car.  She has had increasing anxiety over the last few weeks. Did not have any side effects to medications.  She is having episodes of overthinking which is causing her to panic. Denies any known  stressors, in her life other than normal daily life.  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  She denies she has palpitations whenever she has anxiety feeling.  She only associates this with anxiety.  She has no other associated symptoms of cardiac disease, no shortness of breath.  She denies neck pain jaw pain or any cardiac symptoms.  At previous visit she thought she was doing better without the Paxil, she has had had anxiety off the charts for the last few weeks she reports.  She had my chart message me and I will send in Wellbutrin for her to try, she just started this Wellbutrin 3 days ago, I did prescribe it about 2 weeks ago.  Patient though thought that maybe her menstrual cycle was causing her to have more anxiety. She has taken other SSRIs and SNRIs with the same symptoms, she reports that Paxil always made her feel like she was in a trance like state, so she only took a fourth of a tablet.  She also gained weight with it and did not like the side effect of that.  . Monday night she woke up and took Hydroxyzine  and she went to sleep.  She had a panic attack in the middle the night that is why she took the hydroxyzine.  She is sleeping great most nights.   Denies any suicidal or homicidal ideations or intents.  Patient  denies any fever, body aches,chills, rash, chest pain, shortness of breath, nausea, vomiting, or diarrhea.   LMP 10/18/2019  She wants [facility] and is willing to talk with them for options.  She has no lightheadedness dizziness or syncope presyncopal episodes. Patient  denies any fever, body aches,chills, rash, chest pain, shortness of breath, nausea, vomiting, or diarrhea.   ------------------------------------------------------------------------   No Known Allergies   Current Outpatient Medications:  .  Ascorbic Acid (VITAMIN C PO), Take by mouth., Disp: , Rfl:  .  BLISOVI FE 1/20 1-20 MG-MCG tablet, TAKE 1  TABLET BY MOUTH DAILY, Disp: 1 Package, Rfl: 0 .  Zinc Sulfate (ZINC 15 PO), Take by mouth., Disp: , Rfl:  .  buPROPion (WELLBUTRIN XL) 150 MG 24 hr tablet, Take 1 tablet (150 mg total) by mouth daily., Disp: 90 tablet, Rfl: 0 .  Cholecalciferol (VITAMIN D) 50 MCG (2000 UT) tablet, Take 2,000 Units by mouth daily., Disp: , Rfl:  .  hydrOXYzine (ATARAX/VISTARIL) 25 MG tablet, Take 1 tablet (25 mg total) by mouth every 8 (eight) hours as needed for anxiety (may cause drowsiness). (Patient not taking: Reported on 10/26/2019), Disp: 30 tablet, Rfl: 0  Review of Systems  Constitutional: Negative for irritability.  Respiratory: Negative for shortness of breath.   Cardiovascular: Positive for palpitations. Negative for chest pain.  Gastrointestinal: Negative for nausea.  Genitourinary: Negative for impotence.  Neurological: Negative for dizziness.  Psychiatric/Behavioral: Negative for confusion, decreased concentration and suicidal ideas. The patient is nervous/anxious. The patient does not have insomnia.     Social History   Tobacco Use  . Smoking status: Never Smoker  . Smokeless tobacco: Never Used  Substance Use Topics  . Alcohol use: No   GAD 15 at today's office visit.    Objective:    Body mass index is 28.54 kg/m. Vitals with BMI 10/26/2019 10/26/2019 09/13/2019  Height - - -  Weight 176 lbs 13 oz - 188 lbs  BMI - - -  Systolic 120 - 122  Diastolic 84 - 82  Pulse 107 98 112     Physical Exam Vitals reviewed.  Constitutional:      General: She is not in acute distress.    Appearance: She is not ill-appearing, toxic-appearing or diaphoretic.     Comments: Patient is alert and oriented and responsive to questions Engages in eye contact with provider. Speaks in full sentences without any pauses without any shortness of breath or distress.    HENT:     Head: Normocephalic and atraumatic.     Right Ear: External ear normal.     Left Ear: External ear normal.     Nose: Nose  normal.     Mouth/Throat:     Mouth: Mucous membranes are moist.  Eyes:     Extraocular Movements: Extraocular movements intact.     Conjunctiva/sclera: Conjunctivae normal.     Pupils: Pupils are equal, round, and reactive to light.  Cardiovascular:     Rate and Rhythm: Normal rate and regular rhythm.     Pulses: Normal pulses.     Heart sounds: Normal heart sounds. No murmur. No friction rub. No gallop.   Pulmonary:     Effort: Pulmonary effort is normal. No respiratory distress.     Breath sounds: Normal breath sounds. No stridor. No wheezing, rhonchi or rales.  Chest:     Chest wall: No tenderness.  Abdominal:     Palpations: Abdomen is soft.  Musculoskeletal:  General: Normal range of motion.     Cervical back: Normal range of motion and neck supple.  Skin:    General: Skin is warm.     Capillary Refill: Capillary refill takes less than 2 seconds.  Neurological:     Mental Status: She is alert and oriented to person, place, and time.  Psychiatric:        Mood and Affect: Affect normal. Mood is anxious.        Behavior: Behavior normal. Behavior is cooperative.        Thought Content: Thought content normal.        Cognition and Memory: Cognition and memory normal.        Judgment: Judgment normal.     Comments: She is pleasant, and in no acute distress, she is very anxious during conversation and does work-up multiple questions signifying anxiety related to normal life situations.      No results found for any visits on 10/26/19.     Assessment & Plan        Anxiety  I did advise her that I feel she should try the Wellbutrin 150 mg XL by mouth once daily every 24 hours.  She has only been on this medication for 3 days.  She has had a lifetime history of anxiety and has not been completely controlled, and she has had side effects other antidepressants that she did not like at times.  She did do well on Paxil for a while years ago but still did not like the  weight gain and the fatigue that it caused.  Aware of all blackbox warnings on antidepressants, antianxiety medications.  She is aware that we can titrate medication up, she would prefer to started lower dose initially, I will reevaluate her in 4 weeks to see how she is doing, she will call me if any symptoms change or worsen at any time.  Did advise her that she could go back to her Klonopin and use it as needed while she is waiting on the Wellbutrin to start working. Do not take it with Atarax/hydroxyzine.  Verbalized understanding of medication instructions, she does not abuse medications, she uses less than then as needed for anxiety.  Orders Placed This Encounter  Procedures  . Ambulatory referral to Psychiatry    Referral Priority:   Routine    Referral Type:   Psychiatric    Referral Reason:   Specialty Services Required    Requested Specialty:   Psychiatry    Number of Visits Requested:   1    I also recommend the patient that counseling would possibly help her with her anxiety that may have some other strategies that she can do to reduce stress.  Decrease screen time, rest and relaxation techniques also discussed.  She is in agreement with this plan and will continue the medication she is aware that we can increase her dose if we need to once we see how she responds to this medication.  Side effects were discussed and when to seek care immediately.  Return in about 4 weeks (around 11/23/2019), or if symptoms worsen or fail to improve, for at any time for any worsening symptoms, Go to Emergency room/ urgent care if worse.   Advised patient call the office or your primary care doctor for an appointment if no improvement within 72 hours or if any symptoms change or worsen at any time  Advised ER or urgent Care if after hours or on weekend. Call  [phone] for emergency symptoms at any time.Patinet verbalized understanding of all instructions given/reviewed and treatment plan and has no further  questions or concerns at this time.       The entirety of the information documented in the History of Present Illness, Review of Systems and Physical Exam were personally obtained by me. Portions of this information were initially documented by the  Certified Medical Assistant whose name is documented in Epic and reviewed by me for thoroughness and accuracy.  I have personally performed the exam and reviewed the chart and it is accurate to the best of my knowledge.  Museum/gallery conservator has been used and any errors in dictation or transcription are unintentional.  Eula Fried. Ramiah Helfrich FNP-C  [facility]   Jairo Ben, FNP  [facility]

## 2019-10-26 NOTE — Patient Instructions (Signed)
Generalized Anxiety Disorder, Adult Generalized anxiety disorder (GAD) is a mental health disorder. People with this condition constantly worry about everyday events. Unlike normal anxiety, worry related to GAD is not triggered by a specific event. These worries also do not fade or get better with time. GAD interferes with life functions, including relationships, work, and school. GAD can vary from mild to severe. People with severe GAD can have intense waves of anxiety with physical symptoms (panic attacks). What are the causes? The exact cause of GAD is not known. What increases the risk? This condition is more likely to develop in:  Women.  People who have a family history of anxiety disorders.  People who are very shy.  People who experience very stressful life events, such as the death of a loved one.  People who have a very stressful family environment. What are the signs or symptoms? People with GAD often worry excessively about many things in their lives, such as their health and family. They may also be overly concerned about:  Doing well at work.  Being on time.  Natural disasters.  Friendships. Physical symptoms of GAD include:  Fatigue.  Muscle tension or having muscle twitches.  Trembling or feeling shaky.  Being easily startled.  Feeling like your heart is pounding or racing.  Feeling out of breath or like you cannot take a deep breath.  Having trouble falling asleep or staying asleep.  Sweating.  Nausea, diarrhea, or irritable bowel syndrome (IBS).  Headaches.  Trouble concentrating or remembering facts.  Restlessness.  Irritability. How is this diagnosed? Your health care provider can diagnose GAD based on your symptoms and medical history. You will also have a physical exam. The health care provider will ask specific questions about your symptoms, including how severe they are, when they started, and if they come and go. Your health care  provider may ask you about your use of alcohol or drugs, including prescription medicines. Your health care provider may refer you to a mental health specialist for further evaluation. Your health care provider will do a thorough examination and may perform additional tests to rule out other possible causes of your symptoms. To be diagnosed with GAD, a person must have anxiety that:  Is out of his or her control.  Affects several different aspects of his or her life, such as work and relationships.  Causes distress that makes him or her unable to take part in normal activities.  Includes at least three physical symptoms of GAD, such as restlessness, fatigue, trouble concentrating, irritability, muscle tension, or sleep problems. Before your health care provider can confirm a diagnosis of GAD, these symptoms must be present more days than they are not, and they must last for six months or longer. How is this treated? The following therapies are usually used to treat GAD:  Medicine. Antidepressant medicine is usually prescribed for long-term daily control. Antianxiety medicines may be added in severe cases, especially when panic attacks occur.  Talk therapy (psychotherapy). Certain types of talk therapy can be helpful in treating GAD by providing support, education, and guidance. Options include: ? Cognitive behavioral therapy (CBT). People learn coping skills and techniques to ease their anxiety. They learn to identify unrealistic or negative thoughts and behaviors and to replace them with positive ones. ? Acceptance and commitment therapy (ACT). This treatment teaches people how to be mindful as a way to cope with unwanted thoughts and feelings. ? Biofeedback. This process trains you to manage your body's response (  physiological response) through breathing techniques and relaxation methods. You will work with a therapist while machines are used to monitor your physical symptoms.  Stress  management techniques. These include yoga, meditation, and exercise. A mental health specialist can help determine which treatment is best for you. Some people see improvement with one type of therapy. However, other people require a combination of therapies. Follow these instructions at home:  Take over-the-counter and prescription medicines only as told by your health care provider.  Try to maintain a normal routine.  Try to anticipate stressful situations and allow extra time to manage them.  Practice any stress management or self-calming techniques as taught by your health care provider.  Do not punish yourself for setbacks or for not making progress.  Try to recognize your accomplishments, even if they are small.  Keep all follow-up visits as told by your health care provider. This is important. Contact a health care provider if:  Your symptoms do not get better.  Your symptoms get worse.  You have signs of depression, such as: ? A persistently sad, cranky, or irritable mood. ? Loss of enjoyment in activities that used to bring you joy. ? Change in weight or eating. ? Changes in sleeping habits. ? Avoiding friends or family members. ? Loss of energy for normal tasks. ? Feelings of guilt or worthlessness. Get help right away if:  You have serious thoughts about hurting yourself or others. If you ever feel like you may hurt yourself or others, or have thoughts about taking your own life, get help right away. You can go to your nearest emergency department or call:  Your local emergency services (911 in the [place]  A suicide crisis helpline, such as the National Suicide Prevention Lifeline at (620) 881-5788. This is open 24 hours a day. Summary  Generalized anxiety disorder (GAD) is a mental health disorder that involves worry that is not triggered by a specific event.  People with GAD often worry excessively about many things in their lives, such as their health and  family.  GAD may cause physical symptoms such as restlessness, trouble concentrating, sleep problems, frequent sweating, nausea, diarrhea, headaches, and trembling or muscle twitching.  A mental health specialist can help determine which treatment is best for you. Some people see improvement with one type of therapy. However, other people require a combination of therapies. This information is not intended to replace advice given to you by your health care provider. Make sure you discuss any questions you have with your health care provider. Document Revised: 07/01/2017 Document Reviewed: 06/08/2016 Elsevier Patient Education  2020 Elsevier Inc. Bupropion extended-release tablets (Depression/Mood Disorders) What is this medicine? BUPROPION (byoo PROE pee on) is used to treat depression. This medicine may be used for other purposes; ask your health care provider or pharmacist if you have questions. COMMON BRAND NAME(S): Aplenzin, Budeprion XL, Forfivo XL, Wellbutrin XL What should I tell my health care provider before I take this medicine? They need to know if you have any of these conditions:  an eating disorder, such as anorexia or bulimia  bipolar disorder or psychosis  diabetes or high blood sugar, treated with medication  glaucoma  head injury or brain tumor  heart disease, previous heart attack, or irregular heart beat  high blood pressure  kidney or liver disease  seizures (convulsions)  suicidal thoughts or a previous suicide attempt  Tourette's syndrome  weight loss  an unusual or allergic reaction to bupropion, other medicines, foods, dyes, or  preservatives  breast-feeding  pregnant or trying to become pregnant How should I use this medicine? Take this medicine by mouth with a glass of water. Follow the directions on the prescription label. You can take it with or without food. If it upsets your stomach, take it with food. Do not crush, chew, or cut these  tablets. This medicine is taken once daily at the same time each day. Do not take your medicine more often than directed. Do not stop taking this medicine suddenly except upon the advice of your doctor. Stopping this medicine too quickly may cause serious side effects or your condition may worsen. A special MedGuide will be given to you by the pharmacist with each prescription and refill. Be sure to read this information carefully each time. Talk to your pediatrician regarding the use of this medicine in children. Special care may be needed. Overdosage: If you think you have taken too much of this medicine contact a poison control center or emergency room at once. NOTE: This medicine is only for you. Do not share this medicine with others. What if I miss a dose? If you miss a dose, skip the missed dose and take your next tablet at the regular time. Do not take double or extra doses. What may interact with this medicine? Do not take this medicine with any of the following medications:  linezolid  MAOIs like Azilect, Carbex, Eldepryl, Marplan, Nardil, and Parnate  methylene blue (injected into a vein)  other medicines that contain bupropion like Zyban This medicine may also interact with the following medications:  alcohol  certain medicines for anxiety or sleep  certain medicines for blood pressure like metoprolol, propranolol  certain medicines for depression or psychotic disturbances  certain medicines for HIV or AIDS like efavirenz, lopinavir, nelfinavir, ritonavir  certain medicines for irregular heart beat like propafenone, flecainide  certain medicines for Parkinson's disease like amantadine, levodopa  certain medicines for seizures like carbamazepine, phenytoin, phenobarbital  cimetidine  clopidogrel  cyclophosphamide  digoxin  furazolidone  isoniazid  nicotine  orphenadrine  procarbazine  steroid medicines like prednisone or cortisone  stimulant medicines  for attention disorders, weight loss, or to stay awake  tamoxifen  theophylline  thiotepa  ticlopidine  tramadol  warfarin This list may not describe all possible interactions. Give your health care provider a list of all the medicines, herbs, non-prescription drugs, or dietary supplements you use. Also tell them if you smoke, drink alcohol, or use illegal drugs. Some items may interact with your medicine. What should I watch for while using this medicine? Tell your doctor if your symptoms do not get better or if they get worse. Visit your doctor or healthcare provider for regular checks on your progress. Because it may take several weeks to see the full effects of this medicine, it is important to continue your treatment as prescribed by your doctor. This medicine may cause serious skin reactions. They can happen weeks to months after starting the medicine. Contact your healthcare provider right away if you notice fevers or flu-like symptoms with a rash. The rash may be red or purple and then turn into blisters or peeling of the skin. Or, you might notice a red rash with swelling of the face, lips or lymph nodes in your neck or under your arms. Patients and their families should watch out for new or worsening thoughts of suicide or depression. Also watch out for sudden changes in feelings such as feeling anxious, agitated, panicky, irritable, hostile,  aggressive, impulsive, severely restless, overly excited and hyperactive, or not being able to sleep. If this happens, especially at the beginning of treatment or after a change in dose, call your healthcare provider. Avoid alcoholic drinks while taking this medicine. Drinking large amounts of alcoholic beverages, using sleeping or anxiety medicines, or quickly stopping the use of these agents while taking this medicine may increase your risk for a seizure. Do not drive or use heavy machinery until you know how this medicine affects you. This  medicine can impair your ability to perform these tasks. Do not take this medicine close to bedtime. It may prevent you from sleeping. Your mouth may get dry. Chewing sugarless gum or sucking hard candy, and drinking plenty of water may help. Contact your doctor if the problem does not go away or is severe. The tablet shell for some brands of this medicine does not dissolve. This is normal. The tablet shell may appear whole in the stool. This is not a cause for concern. What side effects may I notice from receiving this medicine? Side effects that you should report to your doctor or health care professional as soon as possible:  allergic reactions like skin rash, itching or hives, swelling of the face, lips, or tongue  breathing problems  changes in vision  confusion  elevated mood, decreased need for sleep, racing thoughts, impulsive behavior  fast or irregular heartbeat  hallucinations, loss of contact with reality  increased blood pressure  rash, fever, and swollen lymph nodes  redness, blistering, peeling or loosening of the skin, including inside the mouth  seizures  suicidal thoughts or other mood changes  unusually weak or tired  vomiting Side effects that usually do not require medical attention (report to your doctor or health care professional if they continue or are bothersome):  constipation  headache  loss of appetite  nausea  tremors  weight loss This list may not describe all possible side effects. Call your doctor for medical advice about side effects. You may report side effects to FDA at 1-800-FDA-1088. Where should I keep my medicine? Keep out of the reach of children. Store at room temperature between 15 and 30 degrees C (59 and 86 degrees F). Throw away any unused medicine after the expiration date. NOTE: This sheet is a summary. It may not cover all possible information. If you have questions about this medicine, talk to your doctor,  pharmacist, or health care provider.  2020 Elsevier/Gold Standard (2018-10-12 13:45:31)

## 2019-11-01 ENCOUNTER — Telehealth: Payer: Self-pay

## 2019-11-01 ENCOUNTER — Telehealth (INDEPENDENT_AMBULATORY_CARE_PROVIDER_SITE_OTHER): Payer: 59 | Admitting: Physician Assistant

## 2019-11-01 DIAGNOSIS — F419 Anxiety disorder, unspecified: Secondary | ICD-10-CM

## 2019-11-01 DIAGNOSIS — R69 Illness, unspecified: Secondary | ICD-10-CM | POA: Diagnosis not present

## 2019-11-01 MED ORDER — SERTRALINE HCL 50 MG PO TABS: 50.0000 mg | ORAL_TABLET | Freq: Every day | ORAL | 1 refills | Status: DC

## 2019-11-01 NOTE — Telephone Encounter (Signed)
I spoke with patient and advised her that Mrs. Flinchum is not in office and that I cannot prescribe medication, she was advised to return back to clinic to discuss need for change in prescription. Patient primary is Tara Foley, please advise. KW       Copied from CRM [id number]. Topic: General - Other >> Oct 31, 2019  4:39 PM Dalphine Handing A wrote: Patient would like a callback from Nicholos Johns in regards to getting the zoloft prescribed. Patient stated that she doesn't not want to wait until Flinchum is back in office and stated that she has been seen 3 times in the last 2 weeks. Please advise

## 2019-11-01 NOTE — Progress Notes (Signed)
Patient: Tara Foley Female    DOB: 1991/01/31   29 y.o.   MRN: [id number] Visit Date: 11/01/2019  Today's Provider: Trey Sailors, PA-C   Chief Complaint  Patient presents with  . Anxiety   Subjective:    Virtual Visit via Video Note  I connected with [name] on 11/01/19 at 10:40 AM EDT by a video enabled telemedicine application and verified that I am speaking with the correct person using two identifiers.  Location: Patient: Home Provider: Office    I discussed the limitations of evaluation and management by telemedicine and the availability of in person appointments. The patient expressed understanding and agreed to proceed.   HPI  Patient with history of depression and anxiety presents today with the same. She was last seen by myself in 2020 and was increased on her Paxil to 20 mg daily. Most recently returned and saw another provider in 09/2019 and was discontinued on Paxil due to change in manufacturer and its potential role in palpitations.  She reports picking up a pill from the new manufacturer and noting she felt different when taking it. She was changed to wellbutrin and she felt very sick and had tremors. She took this for 9 days until discontinuation due to side effects. She also has a history of using zoloft which was helpful for her. [facility] but did not go as of yet.   No Known Allergies   Current Outpatient Medications:  .  Ascorbic Acid (VITAMIN C PO), Take by mouth., Disp: , Rfl:  .  BLISOVI FE 1/20 1-20 MG-MCG tablet, TAKE 1 TABLET BY MOUTH DAILY, Disp: 1 Package, Rfl: 0 .  buPROPion (WELLBUTRIN XL) 150 MG 24 hr tablet, Take 1 tablet (150 mg total) by mouth daily., Disp: 90 tablet, Rfl: 0 .  Cholecalciferol (VITAMIN D) 50 MCG (2000 UT) tablet, Take 2,000 Units by mouth daily., Disp: , Rfl:  .  hydrOXYzine (ATARAX/VISTARIL) 25 MG tablet, Take 1 tablet (25 mg total) by mouth every 8 (eight) hours as needed  for anxiety (may cause drowsiness). (Patient not taking: Reported on 10/26/2019), Disp: 30 tablet, Rfl: 0 .  Zinc Sulfate (ZINC 15 PO), Take by mouth., Disp: , Rfl:   Review of Systems  Constitutional: Negative.   Respiratory: Negative.   Cardiovascular: Negative.   Genitourinary: Negative.   Neurological: Negative.     Social History   Tobacco Use  . Smoking status: Never Smoker  . Smokeless tobacco: Never Used  Substance Use Topics  . Alcohol use: No      Objective:   There were no vitals taken for this visit. There were no vitals filed for this visit.There is no height or weight on file to calculate BMI.   Physical Exam   No results found for any visits on 11/01/19.     Assessment & Plan    1. Anxiety  As she has done well previously on zoloft, we will start as below. Paxil shows rate of palpitations to be 2-3% and zoloft does not have this side effect listed. Counseled palpitations may be due to side effects or possible due to anxiety as well. If anxiety not well controlled, may add buspar. Patient declines counseling at this time.   - sertraline (ZOLOFT) 50 MG tablet; Take 1 tablet (50 mg total) by mouth daily.  Dispense: 90 tablet; Refill: 1 I discussed the assessment and treatment plan with the patient. The patient  was provided an opportunity to ask questions and all were answered. The patient agreed with the plan and demonstrated an understanding of the instructions.   The patient was advised to call back or seek an in-person evaluation if the symptoms worsen or if the condition fails to improve as anticipated.  I provided 25 minutes of non-face-to-face time during this encounter.  The entirety of the information documented in the History of Present Illness, Review of Systems and Physical Exam were personally obtained by me. Portions of this information were initially documented by [facility] and reviewed by me for thoroughness and accuracy.           Trinna Post, PA-C  [facility]

## 2019-11-30 ENCOUNTER — Ambulatory Visit: Payer: Self-pay | Admitting: Adult Health

## 2019-12-11 ENCOUNTER — Encounter: Payer: Self-pay | Admitting: Physician Assistant

## 2019-12-11 ENCOUNTER — Ambulatory Visit
Admission: EM | Admit: 2019-12-11 | Discharge: 2019-12-11 | Disposition: A | Discharge status: Home / Self Care | Payer: 59 | Attending: Urgent Care | Admitting: Urgent Care

## 2019-12-11 ENCOUNTER — Encounter: Payer: Self-pay | Admitting: Emergency Medicine

## 2019-12-11 ENCOUNTER — Other Ambulatory Visit: Payer: Self-pay

## 2019-12-11 DIAGNOSIS — Z0189 Encounter for other specified special examinations: Secondary | ICD-10-CM | POA: Diagnosis not present

## 2019-12-11 DIAGNOSIS — F419 Anxiety disorder, unspecified: Secondary | ICD-10-CM | POA: Diagnosis present

## 2019-12-11 DIAGNOSIS — M6281 Muscle weakness (generalized): Secondary | ICD-10-CM | POA: Diagnosis not present

## 2019-12-11 DIAGNOSIS — R5383 Other fatigue: Secondary | ICD-10-CM | POA: Diagnosis not present

## 2019-12-11 DIAGNOSIS — R69 Illness, unspecified: Secondary | ICD-10-CM | POA: Diagnosis not present

## 2019-12-11 LAB — CBC
HCT: 45.4 % (ref 36.0–46.0)
Hemoglobin: 15.4 g/dL — ABNORMAL HIGH (ref 12.0–15.0)
MCH: 28.9 pg (ref 26.0–34.0)
MCHC: 33.9 g/dL (ref 30.0–36.0)
MCV: 85.3 fL (ref 80.0–100.0)
Platelets: 327 10*3/uL (ref 150–400)
RBC: 5.32 MIL/uL — ABNORMAL HIGH (ref 3.87–5.11)
RDW: 12.9 % (ref 11.5–15.5)
WBC: 7.7 10*3/uL (ref 4.0–10.5)
nRBC: 0 % (ref 0.0–0.2)

## 2019-12-11 LAB — BASIC METABOLIC PANEL
Anion gap: 11 (ref 5–15)
BUN: 7 mg/dL (ref 6–20)
CO2: 25 mmol/L (ref 22–32)
Calcium: 9.8 mg/dL (ref 8.9–10.3)
Chloride: 105 mmol/L (ref 98–111)
Creatinine, Ser: 0.77 mg/dL (ref 0.44–1.00)
GFR calc Af Amer: >60 mL/min (ref >60)
GFR calc non Af Amer: >60 mL/min (ref >60)
Glucose, Bld: 98 mg/dL (ref 70–99)
Potassium: 4.3 mmol/L (ref 3.5–5.1)
Sodium: 141 mmol/L (ref 135–145)

## 2019-12-11 LAB — VITAMIN B12: Vitamin B-12: 244 pg/mL (ref 180–914)

## 2019-12-11 NOTE — ED Triage Notes (Signed)
Patient c/o muscle weakness and shakiness that started last Friday. She states her anxiety had improved but since last Friday she has felt more anxious. She is requesting blood work, specifically "electrolytes."

## 2019-12-11 NOTE — Discharge Instructions (Addendum)
It was very nice seeing you today in clinic. Thank you for entrusting me with your care.   Labs pending. Will call you with the results. Make sure you are staying well hydrated. Increase sertraline as discussed with PCP. Consider Buspar. Call me if there are any issues.   Make arrangements to follow up with your regular doctor in 1 week for re-evaluation if not improving.  If your symptoms/condition worsens, please seek follow up care either here or in the ER. Please remember, our [facility] providers are "right here with you" when you need [place].   Again, it was my pleasure to take care of you today. Thank you for choosing our clinic. I hope that you start to feel better quickly.   Quentin Mulling, MSN, APRN, FNP-C, CEN Advanced Practice Provider [facility] [place] [facility]

## 2019-12-12 NOTE — ED Provider Notes (Signed)
[place], [place]   Name: KLA BILY DOB: Mar 21, 1991 MRN: [id number] CSN: [id number] PCP: Trey Sailors, PA-C  Arrival date and time:  12/11/19 1136  Chief Complaint:  Anxiety  NOTE: Prior to seeing the patient today, I have reviewed the triage nursing documentation and vital signs. Clinical staff has updated patient's PMH/PSHx, current medication list, and drug allergies/intolerances to ensure comprehensive history available to assist in medical decision making.   History:   HPI: CATHLIN BUCHAN is a 29 y.o. female who presents today with complaints of generalized weakness and "shakiness" that started following a panic attack on Friday (12/07/2019). Patient has a long stand history of anxiety that has been successfully treated with paroxetine until here recently. Patient began to experience palpitations on the paroxetine. She was worked up by her PCP for endocrine and metabolic etiologies; labs were negative. Patient finally learned that her pharmacy had changed manufacturers on her prescribed SSRI; manufacturer discontinued production. She was subsequently trialed on bupropion, which did not offered her the anxiolytic effects that she achieved in the past with the paroxetine. Of not, patient has BZO (clonazepam) to use on a PRN basis, however she does not like to use this medication due to the associated side effects. She has also tried hydroxyzine for PRN use when her anxiety increases, however advises that this medication made her somnolent and unable to function. Follow up visits with her PCP led to patient being started on sertraline 50 mg daily, which she has been on for about a month at this point. Patient notes that she feels like this medication has been effective overall until 12/07/2019 when she suffered the panic attack. Since then, patient has felt fatigued, weak, and experiences intermittent muscle twitching. She contacted her PCP and was advised to increase her sertraline dose to  100 mg daily. Patient presents today requesting labs to check her electrolytes.    Past Medical History:  Diagnosis Date  . Anxiety     Past Surgical History:  Procedure Laterality Date  . NO PAST SURGERIES      Family History  Problem Relation Age of Onset  . Hypertension Mother     Social History   Tobacco Use  . Smoking status: Never Smoker  . Smokeless tobacco: Never Used  Substance Use Topics  . Alcohol use: No  . Drug use: No    Patient Active Problem List   Diagnosis Date Noted  . Palpitations 09/06/2019  . Hyperlipidemia 03/15/2016  . Agoraphobia 02/26/2016  . Allergic rhinitis 02/26/2016  . Anxiety 02/26/2016    Home Medications:    Current Meds  Medication Sig  . BLISOVI FE 1/20 1-20 MG-MCG tablet TAKE 1 TABLET BY MOUTH DAILY  . sertraline (ZOLOFT) 50 MG tablet Take 1 tablet (50 mg total) by mouth daily. (Patient taking differently: Take 100 mg by mouth daily. )    Allergies:   Patient has no known allergies.  Review of Systems (ROS):  Review of systems NEGATIVE unless otherwise noted in narrative H&P section.   Vital Signs: Today's Vitals   12/11/19 1157 12/11/19 1200 12/11/19 1215  BP:  (!) 153/103   Pulse:  (!) 108   Resp:  18   Temp:  98.2 F (36.8 C)   TempSrc:  Oral   SpO2:  100%   Weight: 170 lb (77.1 kg)    Height: 5\' 5"  (1.651 m)    PainSc: 0-No pain  0-No pain    Physical Exam: Physical Exam  Constitutional:  She is oriented to person, place, and time and well-developed, well-nourished, and in no distress.  HENT:  Head: Normocephalic and atraumatic.  Eyes: Pupils are equal, round, and reactive to light.  Cardiovascular: Regular rhythm, normal heart sounds and intact distal pulses. Tachycardia present.  Pulmonary/Chest: Effort normal and breath sounds normal.  Neurological: She is alert and oriented to person, place, and time. Gait normal.  Skin: Skin is warm and dry. No rash noted. She is not diaphoretic.  Psychiatric:  Memory, affect and judgment normal. Her mood appears anxious.  Nursing note and vitals reviewed.   Urgent Care Treatments / Results:   Orders Placed This Encounter  Procedures  . CBC  . Basic metabolic panel  . Vitamin B12    LABS: PLEASE NOTE: all labs that were ordered this encounter are listed, however only abnormal results are displayed. Labs Reviewed  CBC - Abnormal; Notable for the following components:      Result Value   RBC 5.32 (*)    Hemoglobin 15.4 (*)    All other components within normal limits  BASIC METABOLIC PANEL  VITAMIN I45    EKG: -None  RADIOLOGY: No results found.  PROCEDURES: Procedures  MEDICATIONS RECEIVED THIS VISIT: Medications - No data to display  PERTINENT CLINICAL COURSE NOTES/UPDATES:   Initial Impression / Assessment and Plan / Urgent Care Course:  Pertinent labs & imaging results that were available during my care of the patient were personally reviewed by me and considered in my medical decision making (see lab/imaging section of note for values and interpretations).  GEORGANNE SIPLE is a 29 y.o. female who presents to [facility] today with complaints of Anxiety  Patient is well appearing overall in clinic today. She does not appear to be in any acute distress. Presenting symptoms (see HPI) and exam as documented above. Patient requesting last today to assess muscle weakness and acute onset fatigue. Will pursue workup as follows:   WBC 7.7. Hgb 15.4, Hct 45.4, MCV 85.3, <CH 28.9, and platelets  327 K/uL.   Na 141, K+ 4.2, glucose 98, BUN 7, and creatinine 0.77 mg/dL.   B12 low at 244 pg/mL.   Patient left clinic prior to labs results. Patient was contacted via MyChart to make her aware of results. CBC and BMP normal. B12 low (goal > 350 pg/mL). Recommended to start oral B12 1,000 mcg daily and have level rechecked in 1 month by PCP. If not improving, patient could be considered for parenteral supplementation. Low B12  could potentially be contributory to patient's increased anxiety, thus supplementation is felt to be beneficial for both her physical (energy/fatigue) and emotional (anxiety) health.   ANXIETY:  Patient is questioning whether current combination oral contraceptive could be causing her anxiety. She notes that she did not have anxiety issues "this bad" prior to starting contraceptive therapy. Patient's husband is planning on having a vasectomy in July, thus prompting conversations as to whether or not she needs to continue OCPs. This is a consideration, as anxiety often has a hormonal component. Advised patient that I would discuss with her PCP to solicit her thoughts. In the interim, until directed by PCP, patient to continue OCP.    Patient asking about restarting paroxetine. PCP has advised to increase sertraline to 100 mg daily. We discussed that sertraline is a good medication. It would be unfortunate for patient to lose the progress that she has made on this medication. Ideally, if she is adamant about switching, I would like  to see her taper off the sertraline prior to starting the paroxetine again. I am not sure this is in her best interest at this time given the fact that sertraline is, at least to some degree, effective. Discussed adding buspirone as an adjuvant intervention to help with her anxiety. Patient wishes to defer at this time, opting to trial the increase sertraline dose to 100 mg daily. Patient to discuss effectiveness of up titration with PCP.   Reached out to patient's PCP Jodi Marble, PA-C) to discuss all of the above. Appreciate the collaboration with PCP on this patient. PCP to reach out to patient later today to discuss today's visit and to answer any questions that she may have. I have reviewed the follow up and strict return precautions for any new or worsening symptoms. Patient is aware of symptoms that would be deemed urgent/emergent, and would thus require further evaluation  either here or in the emergency department. At the time of discharge, she verbalized understanding and consent with the discharge plan as it was reviewed with her. All questions were fielded by provider and/or clinic staff prior to patient discharge.    Final Clinical Impressions / Urgent Care Diagnoses:   Final diagnoses:  Fatigue, unspecified type  Muscle weakness (generalized)  Anxiety  Encounter for laboratory test    New Prescriptions:  [place] Controlled Substance Registry consulted? Not Applicable  No orders of the defined types were placed in this encounter.   Recommended Follow up Care:  Patient encouraged to follow up with the following provider within the specified time frame, or sooner as dictated by the severity of her symptoms. As always, she was instructed that for any urgent/emergent care needs, she should seek care either here or in the emergency department for more immediate evaluation.  Follow-up Information    Trey Sailors, PA-C In 1 week.   Specialty: Physician Assistant Why: General reassessment of symptoms if not improving Contact information: [address] [place] [address] [phone]         NOTE: This note was prepared using Dragon dictation software along with smaller phrase technology. Despite my best ability to proofread, there is the potential that transcriptional errors may still occur from this process, and are completely unintentional.    Verlee Monte, NP 12/12/19 2133

## 2019-12-14 ENCOUNTER — Telehealth: Payer: Self-pay

## 2019-12-14 ENCOUNTER — Telehealth (INDEPENDENT_AMBULATORY_CARE_PROVIDER_SITE_OTHER): Payer: 59 | Admitting: Physician Assistant

## 2019-12-14 DIAGNOSIS — F419 Anxiety disorder, unspecified: Secondary | ICD-10-CM

## 2019-12-14 DIAGNOSIS — R69 Illness, unspecified: Secondary | ICD-10-CM | POA: Diagnosis not present

## 2019-12-14 MED ORDER — PAROXETINE HCL 10 MG PO TABS: 10.0000 mg | ORAL_TABLET | Freq: Every day | ORAL | 0 refills | Status: DC

## 2019-12-14 NOTE — Telephone Encounter (Signed)
Patient send a message via MyChart requesting refill on Paxil 10 mg. Medication send into pharmacy per Adriana.

## 2019-12-14 NOTE — Telephone Encounter (Signed)
Virtual Visit via Telephone Note  I connected with Tara Foley on 12/14/2019 at 11:00 AM by telephone and verified that I am speaking with the correct person using two identifiers.  Location: Patient: Home Provider: Office   I discussed the limitations, risks, security and privacy concerns of performing an evaluation and management service by telephone and the availability of in person appointments. I also discussed with the patient that there may be a patient responsible charge related to this service. The patient expressed understanding and agreed to proceed.   History of Present Illness: Patient is presenting today for follow up of anxiety and depression. Patient had been stable on Paxil for many years. Several months ago she began having palpitations that were thought to be due to Paxil. She was switched off Paxil in favor of wellbutrin which she also did not tolerate. She was switched from wellbutrin to zoloft 50 mg QD which she tolerated but did not have desired effect on mood. She was increased to 100 mg zoloft QD and reports intolerable side effects including dizziness and headache. She would like to restart Paxil.     Observations/Objective:   Assessment and Plan:  1. Anxiety  She can take 50 mg zoloft daily for one week and then start Paxil 10 mg once daily.    Follow Up Instructions: She has follow up scheduled in one month.     I discussed the assessment and treatment plan with the patient. The patient was provided an opportunity to ask questions and all were answered. The patient agreed with the plan and demonstrated an understanding of the instructions.   The patient was advised to call back or seek an in-person evaluation if the symptoms worsen or if the condition fails to improve as anticipated.  I provided 25 minutes of non-face-to-face time during this encounter.   Trey Sailors, PA-C

## 2020-01-01 ENCOUNTER — Telehealth (INDEPENDENT_AMBULATORY_CARE_PROVIDER_SITE_OTHER): Payer: 59 | Admitting: Physician Assistant

## 2020-01-01 DIAGNOSIS — R002 Palpitations: Secondary | ICD-10-CM

## 2020-01-01 DIAGNOSIS — R69 Illness, unspecified: Secondary | ICD-10-CM | POA: Diagnosis not present

## 2020-01-01 DIAGNOSIS — Z3041 Encounter for surveillance of contraceptive pills: Secondary | ICD-10-CM | POA: Diagnosis not present

## 2020-01-01 DIAGNOSIS — F419 Anxiety disorder, unspecified: Secondary | ICD-10-CM | POA: Diagnosis not present

## 2020-01-01 MED ORDER — NORETHIN ACE-ETH ESTRAD-FE 1-20 MG-MCG PO TABS: ORAL_TABLET | ORAL | 3 refills | Status: DC

## 2020-01-01 NOTE — Progress Notes (Signed)
MyChart Video Visit    Virtual Visit via Video Note   This visit type was conducted due to national recommendations for restrictions regarding the COVID-19 Pandemic (e.g. social distancing) in an effort to limit this patient's exposure and mitigate transmission in our community. This patient is at least at moderate risk for complications without adequate follow up. This format is felt to be most appropriate for this patient at this time. Physical exam was limited by quality of the video and audio technology used for the visit.   Patient location: Home Provider location: office   Patient: Tara Foley   DOB: October 15, 1990   29 y.o. Female  MRN: [id number] Visit Date: 01/01/2020  Today's healthcare provider: Trinna Post, PA-C   Chief Complaint  Patient presents with  . Anxiety  I,Ceniya Fowers M Lashaye Fisk,acting as a scribe for Trinna Post, PA-C.,have documented all relevant documentation on the behalf of Trinna Post, PA-C,as directed by  Trinna Post, PA-C while in the presence of Trinna Post, PA-C.  Subjective    HPI Anxiety, Follow-up  She was last seen for anxiety 3 weeks ago. Changes made at last visit include take 50 mg zoloft daily for one week and then start Paxil 10 mg once daily. She wanted to discontinue zoloft due to nausea and restart Paxil as this has worked for her previously. She has been intolerant to wellbutrin in the past as well. She had previously stopped Paxil due to palpitations but this had worked for her prior to this event.    She reports good compliance with treatment. She reports good tolerance of treatment. She is not having side effects.   She feels her anxiety is mild and Improved since last visit. She is not having palpitations this episode. Reports increased anxiety around time of period, she is on [facility] Fe 1/20.  Symptoms: No chest pain No difficulty concentrating  No dizziness No fatigue  No feelings of losing control No  insomnia  No irritable No palpitations  No panic attacks No racing thoughts  No shortness of breath No sweating  No tremors/shakes    GAD-7 Results GAD-7 Generalized Anxiety Disorder Screening Tool 10/26/2019 09/13/2019  1. Feeling Nervous, Anxious, or on Edge 3 1  2. Not Being Able to Stop or Control Worrying 3 0  3. Worrying Too Much About Different Things 3 1  4. Trouble Relaxing 3 0  5. Being So Restless it's Hard To Sit Still 1 0  6. Becoming Easily Annoyed or Irritable 0 0  7. Feeling Afraid As If Something Awful Might Happen 2 1  Total GAD-7 Score 15 3  Difficulty At Work, Home, or Getting  Along With Others? - Not difficult at all    PHQ-9 Scores PHQ9 SCORE ONLY 09/13/2019  PHQ-9 Total Score 0   She would like to get annual exams  ---------------------------------------------------------------------------------------------------      Medications: Outpatient Medications Prior to Visit  Medication Sig  . Cholecalciferol (VITAMIN D) 50 MCG (2000 UT) tablet Take 2,000 Units by mouth daily.  Marland Kitchen PARoxetine (PAXIL) 10 MG tablet Take 1 tablet (10 mg total) by mouth daily.  . [DISCONTINUED] BLISOVI FE 1/20 1-20 MG-MCG tablet TAKE 1 TABLET BY MOUTH DAILY   No facility-administered medications prior to visit.    Review of Systems  Constitutional: Negative.   Respiratory: Negative.   Cardiovascular: Negative.   Hematological: Negative.   Psychiatric/Behavioral: Negative for agitation, behavioral problems, decreased concentration, self-injury, sleep disturbance and suicidal  ideas. The patient is not nervous/anxious.       Objective    There were no vitals taken for this visit.   Physical Exam Constitutional:      Appearance: Normal appearance.  Neurological:     Mental Status: She is alert.  Psychiatric:        Mood and Affect: Mood normal.        Behavior: Behavior normal.        Assessment & Plan    1. Anxiety  Continue Paxil 10 mg daily. Will change OCP  to continuous to avoid withdrawal bleeding to see if this improves anxiety.   2. Palpitations  No palpitations currently on Paxil, may have been due to anxiety. Follow up 09/2020 for CPE and anxiety.  3. Encounter for surveillance of contraceptive pills  - norethindrone-ethinyl estradiol (BLISOVI FE 1/20) 1-20 MG-MCG tablet; Take 1 active pill every day. Skip placebo week and start new pack.  Dispense: 4 Package; Refill: 3  4. Encounter for surveillance of contraceptive pills  - norethindrone-ethinyl estradiol (BLISOVI FE 1/20) 1-20 MG-MCG tablet; Take 1 active pill every day. Skip placebo week and start new pack.  Dispense: 4 Package; Refill: 3    Return in about 8 months (around 09/02/2020) for CPE and follow up .     I discussed the assessment and treatment plan with the patient. The patient was provided an opportunity to ask questions and all were answered. The patient agreed with the plan and demonstrated an understanding of the instructions.   The patient was advised to call back or seek an in-person evaluation if the symptoms worsen or if the condition fails to improve as anticipated.   ITrey Sailors, PA-C, have reviewed all documentation for this visit. The documentation on 01/01/20 for the exam, diagnosis, procedures, and orders are all accurate and complete.   Maryella Shivers [facility] [phone] (phone) [phone] (fax)  [facility]

## 2020-03-05 ENCOUNTER — Other Ambulatory Visit: Payer: Self-pay | Admitting: Physician Assistant

## 2020-03-05 DIAGNOSIS — F419 Anxiety disorder, unspecified: Secondary | ICD-10-CM

## 2020-03-19 ENCOUNTER — Ambulatory Visit: Payer: Self-pay | Admitting: *Deleted

## 2020-03-19 NOTE — Telephone Encounter (Signed)
Multiple fire ant bites on the forearms. Right >Left. approximately  30 bites. Arms with swelling redness. Occurred last night took Benadryl twice bites no longer itch or are painful.Able to move hands and arms. Arms feel tight due to swelling. Noticed hives on the upper abdomen that have resolved. Denies SOB/difficulty breathing/tongue swelling. No systemic fever. Care Advice including ice packs to the arms while elevating arms to help decrease swelling. Hydrocortisone if needed. Watch for signs of infection including red streaks/pus filled bumps/fever. Swelling should subside over the next few days. If swelling increases call back for appointment. She will speak with pcp regarding need for Epipen at her next visit.   Reason for Disposition  Fire ant sting(s) with normal local reaction  Answer Assessment - Initial Assessment Questions 1. SEVERITY: "How many stings are there?"     About 30  2. ONSET: "When did it occur?"     Last night 3. LOCATION: "Where is the sting located?"  "How many stings?"     Both arms and leg, right arm worse. 4. SWELLING: "How big is the swelling?" (e.g., inches or cm)     Mild to moderate.  5. REDNESS: "Is the area red or pink?" If Yes, ask: "What size is the area of redness?" (e.g., inches or cm). "When did the redness start?"     Pink. Last night 6. PAIN: "Is there any pain?" If Yes, ask: "How bad is it?"  (Scale 1-10; or mild, moderate, severe)     No longer painful or itching. Arm feels tight due to swelling 7. ITCHING: "Is there any itching?" If Yes, ask: "How bad is it?"      No longer 8. RESPIRATORY DISTRESS: "Describe your breathing."    Denies SOB/difficulty swallowing.  9. PRIOR REACTIONS: "Have you had any severe allergic reactions to stings in the past?" If yes, ask: "What happened?"     no 10. OTHER SYMPTOMS: "Do you have any other symptoms?" (e.g., abdominal pain, face or tongue swelling, new rash elsewhere, vomiting)       none 11. PREGNANCY:  "Is there any chance you are pregnant?" "When was your last menstrual period?"       Did not ask  Protocols used: FIRE ANT STING-A-AH

## 2020-04-08 ENCOUNTER — Encounter: Payer: Self-pay | Admitting: Physician Assistant

## 2020-04-08 DIAGNOSIS — J0100 Acute maxillary sinusitis, unspecified: Secondary | ICD-10-CM | POA: Diagnosis not present

## 2020-04-08 DIAGNOSIS — Z20822 Contact with and (suspected) exposure to covid-19: Secondary | ICD-10-CM | POA: Diagnosis not present

## 2020-04-08 DIAGNOSIS — R070 Pain in throat: Secondary | ICD-10-CM | POA: Diagnosis not present

## 2020-08-09 ENCOUNTER — Encounter: Payer: Self-pay | Admitting: Physician Assistant

## 2020-09-05 ENCOUNTER — Other Ambulatory Visit: Payer: Self-pay | Admitting: Physician Assistant

## 2020-09-05 DIAGNOSIS — F419 Anxiety disorder, unspecified: Secondary | ICD-10-CM

## 2020-09-09 ENCOUNTER — Encounter: Payer: Self-pay | Admitting: Physician Assistant

## 2020-09-09 ENCOUNTER — Other Ambulatory Visit (HOSPITAL_COMMUNITY)
Admission: RE | Admit: 2020-09-09 | Discharge: 2020-09-09 | Disposition: A | Discharge status: Home / Self Care | Payer: 59 | Source: Ambulatory Visit | Attending: Physician Assistant | Admitting: Physician Assistant

## 2020-09-09 ENCOUNTER — Ambulatory Visit (INDEPENDENT_AMBULATORY_CARE_PROVIDER_SITE_OTHER): Payer: 59 | Admitting: Physician Assistant

## 2020-09-09 ENCOUNTER — Other Ambulatory Visit: Payer: Self-pay

## 2020-09-09 VITALS — BP 128/89 | HR 108 | Temp 97.6°F | Ht 65.0 in | Wt 176.3 lb

## 2020-09-09 DIAGNOSIS — Z124 Encounter for screening for malignant neoplasm of cervix: Secondary | ICD-10-CM | POA: Diagnosis not present

## 2020-09-09 DIAGNOSIS — H9201 Otalgia, right ear: Secondary | ICD-10-CM | POA: Diagnosis not present

## 2020-09-09 DIAGNOSIS — Z1159 Encounter for screening for other viral diseases: Secondary | ICD-10-CM

## 2020-09-09 DIAGNOSIS — Z0000 Encounter for general adult medical examination without abnormal findings: Secondary | ICD-10-CM | POA: Diagnosis not present

## 2020-09-09 DIAGNOSIS — F419 Anxiety disorder, unspecified: Secondary | ICD-10-CM | POA: Diagnosis not present

## 2020-09-09 DIAGNOSIS — R69 Illness, unspecified: Secondary | ICD-10-CM | POA: Diagnosis not present

## 2020-09-09 MED ORDER — PAROXETINE HCL 10 MG PO TABS: 10.0000 mg | ORAL_TABLET | Freq: Every day | ORAL | 2 refills | Status: AC

## 2020-09-09 NOTE — Patient Instructions (Signed)
Health Maintenance, Female Adopting a healthy lifestyle and getting preventive care are important in promoting health and wellness. Ask your health care provider about:  The right schedule for you to have regular tests and exams.  Things you can do on your own to prevent diseases and keep yourself healthy. What should I know about diet, weight, and exercise? Eat a healthy diet  Eat a diet that includes plenty of vegetables, fruits, low-fat dairy products, and lean protein.  Do not eat a lot of foods that are high in solid fats, added sugars, or sodium.   Maintain a healthy weight Body mass index (BMI) is used to identify weight problems. It estimates body fat based on height and weight. Your health care provider can help determine your BMI and help you achieve or maintain a healthy weight. Get regular exercise Get regular exercise. This is one of the most important things you can do for your health. Most adults should:  Exercise for at least 150 minutes each week. The exercise should increase your heart rate and make you sweat (moderate-intensity exercise).  Do strengthening exercises at least twice a week. This is in addition to the moderate-intensity exercise.  Spend less time sitting. Even light physical activity can be beneficial. Watch cholesterol and blood lipids Have your blood tested for lipids and cholesterol at 32 years of age, then have this test every 5 years. Have your cholesterol levels checked more often if:  Your lipid or cholesterol levels are high.  You are older than 32 years of age.  You are at high risk for heart disease. What should I know about cancer screening? Depending on your health history and family history, you may need to have cancer screening at various ages. This may include screening for:  Breast cancer.  Cervical cancer.  Colorectal cancer.  Skin cancer.  Lung cancer. What should I know about heart disease, diabetes, and high blood  pressure? Blood pressure and heart disease  High blood pressure causes heart disease and increases the risk of stroke. This is more likely to develop in people who have high blood pressure readings, are of African descent, or are overweight.  Have your blood pressure checked: ? Every 3-5 years if you are 18-39 years of age. ? Every year if you are 40 years old or older. Diabetes Have regular diabetes screenings. This checks your fasting blood sugar level. Have the screening done:  Once every three years after age 40 if you are at a normal weight and have a low risk for diabetes.  More often and at a younger age if you are overweight or have a high risk for diabetes. What should I know about preventing infection? Hepatitis B If you have a higher risk for hepatitis B, you should be screened for this virus. Talk with your health care provider to find out if you are at risk for hepatitis B infection. Hepatitis C Testing is recommended for:  Everyone born from 1945 through 1965.  Anyone with known risk factors for hepatitis C. Sexually transmitted infections (STIs)  Get screened for STIs, including gonorrhea and chlamydia, if: ? You are sexually active and are younger than 32 years of age. ? You are older than 32 years of age and your health care provider tells you that you are at risk for this type of infection. ? Your sexual activity has changed since you were last screened, and you are at increased risk for chlamydia or gonorrhea. Ask your health care provider   if you are at risk.  Ask your health care provider about whether you are at high risk for HIV. Your health care provider may recommend a prescription medicine to help prevent HIV infection. If you choose to take medicine to prevent HIV, you should first get tested for HIV. You should then be tested every 3 months for as long as you are taking the medicine. Pregnancy  If you are about to stop having your period (premenopausal) and  you may become pregnant, seek counseling before you get pregnant.  Take 400 to 800 micrograms (mcg) of folic acid every day if you become pregnant.  Ask for birth control (contraception) if you want to prevent pregnancy. Osteoporosis and menopause Osteoporosis is a disease in which the bones lose minerals and strength with aging. This can result in bone fractures. If you are 65 years old or older, or if you are at risk for osteoporosis and fractures, ask your health care provider if you should:  Be screened for bone loss.  Take a calcium or vitamin D supplement to lower your risk of fractures.  Be given hormone replacement therapy (HRT) to treat symptoms of menopause. Follow these instructions at home: Lifestyle  Do not use any products that contain nicotine or tobacco, such as cigarettes, e-cigarettes, and chewing tobacco. If you need help quitting, ask your health care provider.  Do not use street drugs.  Do not share needles.  Ask your health care provider for help if you need support or information about quitting drugs. Alcohol use  Do not drink alcohol if: ? Your health care provider tells you not to drink. ? You are pregnant, may be pregnant, or are planning to become pregnant.  If you drink alcohol: ? Limit how much you use to 0-1 drink a day. ? Limit intake if you are breastfeeding.  Be aware of how much alcohol is in your drink. In the [place] one drink equals one 12 oz bottle of beer (355 mL), one 5 oz glass of wine (148 mL), or one 1 oz glass of hard liquor (44 mL). General instructions  Schedule regular health, dental, and eye exams.  Stay current with your vaccines.  Tell your health care provider if: ? You often feel depressed. ? You have ever been abused or do not feel safe at home. Summary  Adopting a healthy lifestyle and getting preventive care are important in promoting health and wellness.  Follow your health care provider's instructions about healthy  diet, exercising, and getting tested or screened for diseases.  Follow your health care provider's instructions on monitoring your cholesterol and blood pressure. This information is not intended to replace advice given to you by your health care provider. Make sure you discuss any questions you have with your health care provider. Document Revised: 07/12/2018 Document Reviewed: 07/12/2018 Elsevier Patient Education  2021 Elsevier Inc.  

## 2020-09-09 NOTE — Progress Notes (Signed)
Complete physical exam   Patient: Tara Foley   DOB: 10/31/1990   30 y.o. Female  MRN: [id number] Visit Date: 09/09/2020  Today's healthcare provider: Trey Sailors, PA-C   Chief Complaint  Patient presents with  . Annual Exam  . Anxiety   Subjective    Tara Foley is a 30 y.o. female who presents today for a complete physical exam.  She reports consuming a general diet. Home exercise routine includes walking 2 days a week. She generally feels well. She reports sleeping well. She does have additional problems to discuss today.   Discontinued loestrin, husband had vasectomy. She feels this has had a positive impact on her anxiety.   HPI  Anxiety, Follow-up  She was last seen for anxiety 8 months ago. Changes made at last visit include continue Paxil 10 mg daily. Patient reports stopping birth control completely due to husband having a vasotomy.   She reports good compliance with treatment. She reports good tolerance of treatment. She is not having side effects.   She feels her anxiety is mild and Improved since last visit.  Symptoms: No chest pain No difficulty concentrating  No dizziness No fatigue  No feelings of losing control No insomnia  No irritable No palpitations  No panic attacks No racing thoughts  No shortness of breath No sweating  No tremors/shakes    GAD-7 Results GAD-7 Generalized Anxiety Disorder Screening Tool 09/09/2020 10/26/2019 09/13/2019  1. Feeling Nervous, Anxious, or on Edge 1 3 1   2. Not Being Able to Stop or Control Worrying 1 3 0  3. Worrying Too Much About Different Things 1 3 1   4. Trouble Relaxing 1 3 0  5. Being So Restless it's Hard To Sit Still 0 1 0  6. Becoming Easily Annoyed or Irritable 0 0 0  7. Feeling Afraid As If Something Awful Might Happen 1 2 1   Total GAD-7 Score 5 15 3   Difficulty At Work, Home, or Getting  Along With Others? Somewhat difficult - Not difficult at all    PHQ-9 Scores PHQ9 SCORE ONLY  09/09/2020 09/13/2019  PHQ-9 Total Score 1 0    ---------------------------------------------------------------------------------------------------   Past Medical History:  Diagnosis Date  . Anxiety    Past Surgical History:  Procedure Laterality Date  . NO PAST SURGERIES     Social History   Socioeconomic History  . Marital status: Married    Spouse name: Not on file  . Number of children: Not on file  . Years of education: Not on file  . Highest education level: Not on file  Occupational History  . Not on file  Tobacco Use  . Smoking status: Never Smoker  . Smokeless tobacco: Never Used  Vaping Use  . Vaping Use: Never used  Substance and Sexual Activity  . Alcohol use: No  . Drug use: No  . Sexual activity: Yes    Birth control/protection: Pill  Other Topics Concern  . Not on file  Social History Narrative  . Not on file   Social Determinants of Health   Financial Resource Strain: Not on file  Food Insecurity: Not on file  Transportation Needs: Not on file  Physical Activity: Not on file  Stress: Not on file  Social Connections: Not on file  Intimate Partner Violence: Not on file   Family Status  Relation Name Status  . Mother  Alive  . Father  Alive  . Son  Alive  . MGM  Deceased  . MGF  Deceased  . PGM  Deceased  . PGF  Deceased   Family History  Problem Relation Age of Onset  . Hypertension Mother    No Known Allergies  Patient Care Team: Maryella Shivers as PCP - General (Physician Assistant)   Medications: Outpatient Medications Prior to Visit  Medication Sig  . Cholecalciferol (VITAMIN D) 50 MCG (2000 UT) tablet Take 2,000 Units by mouth daily.  . [DISCONTINUED] PARoxetine (PAXIL) 10 MG tablet TAKE 1 TABLET BY MOUTH EVERY DAY  . [DISCONTINUED] norethindrone-ethinyl estradiol (BLISOVI FE 1/20) 1-20 MG-MCG tablet Take 1 active pill every day. Skip placebo week and start new pack. (Patient not taking: Reported on 09/09/2020)   No  facility-administered medications prior to visit.    Review of Systems  Constitutional: Negative.   HENT: Negative.   Eyes: Negative.   Respiratory: Negative.   Cardiovascular: Negative.   Gastrointestinal: Negative.   Endocrine: Negative.   Genitourinary: Negative.   Musculoskeletal: Negative.   Skin: Negative.   Allergic/Immunologic: Negative.   Neurological: Negative.   Hematological: Negative.   Psychiatric/Behavioral: Negative.       Objective    BP 128/89 (BP Location: Left Arm, Patient Position: Sitting, Cuff Size: Large)   Pulse (!) 108   Temp 97.6 F (36.4 C) (Oral)   Ht 5\' 5"  (1.651 m)   Wt 176 lb 4.8 oz (80 kg)   LMP 08/22/2020 (Exact Date)   SpO2 100%   BMI 29.34 kg/m    Physical Exam Constitutional:      Appearance: Normal appearance.  HENT:     Right Ear: Ear canal and external ear normal.     Left Ear: Tympanic membrane, ear canal and external ear normal.     Ears:     Comments: Right TM slightly opaque.  Cardiovascular:     Rate and Rhythm: Normal rate and regular rhythm.     Pulses: Normal pulses.     Heart sounds: Normal heart sounds.  Pulmonary:     Effort: Pulmonary effort is normal.     Breath sounds: Normal breath sounds.  Abdominal:     General: Abdomen is flat. Bowel sounds are normal.     Palpations: Abdomen is soft.  Genitourinary:    Exam position: Lithotomy position.     Labia:        Right: No rash.        Left: No rash.      Vagina: Normal.     Cervix: Normal.     Comments: Some bleeding after PAP smear taken.  Skin:    General: Skin is warm and dry.  Neurological:     General: No focal deficit present.     Mental Status: She is alert and oriented to person, place, and time.  Psychiatric:        Mood and Affect: Mood normal.        Behavior: Behavior normal.       Last depression screening scores PHQ 2/9 Scores 09/09/2020 09/13/2019  PHQ - 2 Score 0 0  PHQ- 9 Score 1 0   Last fall risk screening Fall Risk   09/09/2020  Falls in the past year? 0  Number falls in past yr: 0  Injury with Fall? 0  Risk for fall due to : No Fall Risks  Follow up Falls evaluation completed   Last Audit-C alcohol use screening Alcohol Use Disorder Test (AUDIT) 09/09/2020  1. How often do you have  a drink containing alcohol? 0  2. How many drinks containing alcohol do you have on a typical day when you are drinking? 0  3. How often do you have six or more drinks on one occasion? 0  AUDIT-C Score 0   A score of 3 or more in women, and 4 or more in men indicates increased risk for alcohol abuse, EXCEPT if all of the points are from question 1   No results found for any visits on 09/09/20.  Assessment & Plan    Routine Health Maintenance and Physical Exam  Exercise Activities and Dietary recommendations Goals   None     Immunization History  Administered Date(s) Administered  . Tdap 03/21/2016    Health Maintenance  Topic Date Due  . Hepatitis C Screening  Never done  . COVID-19 Vaccine (1) Never done  . HIV Screening  Never done  . INFLUENZA VACCINE  10/30/2020 (Originally 03/02/2020)  . PAP SMEAR-Modifier  07/12/2021  . TETANUS/TDAP  03/21/2026    Discussed health benefits of physical activity, and encouraged her to engage in regular exercise appropriate for her age and condition.  1. Annual physical exam  - HIV Antibody (routine testing w rflx) - TSH - Lipid panel - Comprehensive metabolic panel - CBC with Differential/Platelet  2. Need for hepatitis C screening test  - Hepatitis C antibody  3. Anxiety  - PARoxetine (PAXIL) 10 MG tablet; Take 1 tablet (10 mg total) by mouth daily.  Dispense: 90 tablet; Refill: 2  4. Cervical cancer screening  - Cytology - PAP  5. Right ear pain  Recommend daily 2nd generation antihistamine and flonase.    Return in about 1 year (around 09/09/2021) for CPE and anxiety .     ITrey Sailors, PA-C, have reviewed all documentation for this visit.  The documentation on 09/09/20 for the exam, diagnosis, procedures, and orders are all accurate and complete.  The entirety of the information documented in the History of Present Illness, Review of Systems and Physical Exam were personally obtained by me. Portions of this information were initially documented by [facility] and reviewed by me for thoroughness and accuracy.     Maryella Shivers  [facility] [phone] (phone) [phone] (fax)  [facility]

## 2020-09-10 LAB — CBC WITH DIFFERENTIAL/PLATELET
Basophils Absolute: 0 10*3/uL (ref 0.0–0.2)
Basos: 0 %
EOS (ABSOLUTE): 0.2 10*3/uL (ref 0.0–0.4)
Eos: 2 %
Hematocrit: 43.1 % (ref 34.0–46.6)
Hemoglobin: 14.7 g/dL (ref 11.1–15.9)
Immature Grans (Abs): 0 10*3/uL (ref 0.0–0.1)
Immature Granulocytes: 0 %
Lymphocytes Absolute: 3.2 10*3/uL — ABNORMAL HIGH (ref 0.7–3.1)
Lymphs: 42 %
MCH: 29.9 pg (ref 26.6–33.0)
MCHC: 34.1 g/dL (ref 31.5–35.7)
MCV: 88 fL (ref 79–97)
Monocytes Absolute: 0.5 10*3/uL (ref 0.1–0.9)
Monocytes: 7 %
Neutrophils Absolute: 3.7 10*3/uL (ref 1.4–7.0)
Neutrophils: 49 %
Platelets: 303 10*3/uL (ref 150–450)
RBC: 4.91 x10E6/uL (ref 3.77–5.28)
RDW: 12.3 % (ref 11.7–15.4)
WBC: 7.6 10*3/uL (ref 3.4–10.8)

## 2020-09-10 LAB — LIPID PANEL
Chol/HDL Ratio: 5.9 ratio — ABNORMAL HIGH (ref 0.0–4.4)
Cholesterol, Total: 232 mg/dL — ABNORMAL HIGH (ref 100–199)
HDL: 39 mg/dL — ABNORMAL LOW
LDL Chol Calc (NIH): 171 mg/dL — ABNORMAL HIGH (ref 0–99)
Triglycerides: 121 mg/dL (ref 0–149)
VLDL Cholesterol Cal: 22 mg/dL (ref 5–40)

## 2020-09-10 LAB — COMPREHENSIVE METABOLIC PANEL WITH GFR
ALT: 16 IU/L (ref 0–32)
AST: 20 IU/L (ref 0–40)
Albumin/Globulin Ratio: 1.8 (ref 1.2–2.2)
Albumin: 4.6 g/dL (ref 3.8–4.8)
Alkaline Phosphatase: 71 IU/L (ref 44–121)
BUN/Creatinine Ratio: 9 (ref 9–23)
BUN: 7 mg/dL (ref 6–20)
Bilirubin Total: 0.2 mg/dL (ref 0.0–1.2)
CO2: 22 mmol/L (ref 20–29)
Calcium: 9.8 mg/dL (ref 8.7–10.2)
Chloride: 101 mmol/L (ref 96–106)
Creatinine, Ser: 0.77 mg/dL (ref 0.57–1.00)
GFR calc Af Amer: 118 mL/min/1.73
GFR calc non Af Amer: 102 mL/min/1.73
Globulin, Total: 2.6 g/dL (ref 1.5–4.5)
Glucose: 93 mg/dL (ref 65–99)
Potassium: 4.5 mmol/L (ref 3.5–5.2)
Sodium: 139 mmol/L (ref 134–144)
Total Protein: 7.2 g/dL (ref 6.0–8.5)

## 2020-09-10 LAB — HIV ANTIBODY (ROUTINE TESTING W REFLEX): HIV Screen 4th Generation wRfx: NONREACTIVE

## 2020-09-10 LAB — HEPATITIS C ANTIBODY: Hep C Virus Ab: <0.1 s/co ratio (ref 0.0–0.9)

## 2020-09-10 LAB — TSH: TSH: 4.06 u[IU]/mL (ref 0.450–4.500)

## 2020-09-11 LAB — CYTOLOGY - PAP
Comment: NEGATIVE
Diagnosis: NEGATIVE
High risk HPV: NEGATIVE

## 2020-11-10 DIAGNOSIS — H52213 Irregular astigmatism, bilateral: Secondary | ICD-10-CM | POA: Diagnosis not present

## 2020-11-10 DIAGNOSIS — H43813 Vitreous degeneration, bilateral: Secondary | ICD-10-CM | POA: Diagnosis not present

## 2020-12-05 ENCOUNTER — Telehealth: Payer: Self-pay

## 2020-12-05 NOTE — Telephone Encounter (Signed)
Copied from CRM [id number]. Topic: General - Other >> Dec 05, 2020 11:22 AM Gwenlyn Fudge wrote: Reason for CRM: Pt called stating that she would like to have her blood work done following up from her CPE on 09/09/20. She states that she was told by Ricki Rodriguez that she could come in just for lab work. Please advise.  Please Review It looks like the cholesterol was high?

## 2020-12-08 ENCOUNTER — Other Ambulatory Visit: Payer: Self-pay | Admitting: Family Medicine

## 2020-12-08 DIAGNOSIS — R519 Headache, unspecified: Secondary | ICD-10-CM

## 2020-12-08 DIAGNOSIS — R29898 Other symptoms and signs involving the musculoskeletal system: Secondary | ICD-10-CM

## 2020-12-08 NOTE — Telephone Encounter (Signed)
Left detailed message on voicemail as per DPR.

## 2020-12-08 NOTE — Telephone Encounter (Signed)
Reviewed labs from CPE in 2/22.  Only abnormal was high cholesterol.  At her age, would not start medication for this and would recommend diet low in saturated fat and regular exercise - 30 min at least 5 times per week.  Therefore, no need to recheck labs until 6-12 months from last check. Not due at this time.

## 2020-12-09 ENCOUNTER — Other Ambulatory Visit: Payer: Self-pay | Admitting: Family Medicine

## 2020-12-09 ENCOUNTER — Ambulatory Visit: Payer: 59

## 2020-12-09 DIAGNOSIS — R29898 Other symptoms and signs involving the musculoskeletal system: Secondary | ICD-10-CM

## 2020-12-09 DIAGNOSIS — R519 Headache, unspecified: Secondary | ICD-10-CM

## 2020-12-28 ENCOUNTER — Ambulatory Visit
Admission: RE | Admit: 2020-12-28 | Discharge: 2020-12-28 | Disposition: A | Discharge status: Home / Self Care | Payer: 59 | Source: Ambulatory Visit | Attending: Family Medicine | Admitting: Family Medicine

## 2020-12-28 ENCOUNTER — Other Ambulatory Visit: Payer: Self-pay

## 2020-12-28 ENCOUNTER — Other Ambulatory Visit: Payer: 59

## 2020-12-28 DIAGNOSIS — R519 Headache, unspecified: Secondary | ICD-10-CM | POA: Diagnosis not present

## 2020-12-28 DIAGNOSIS — R531 Weakness: Secondary | ICD-10-CM | POA: Diagnosis not present

## 2020-12-28 DIAGNOSIS — R29898 Other symptoms and signs involving the musculoskeletal system: Secondary | ICD-10-CM

## 2021-01-01 DIAGNOSIS — Z79899 Other long term (current) drug therapy: Secondary | ICD-10-CM | POA: Diagnosis not present

## 2021-01-01 DIAGNOSIS — G43119 Migraine with aura, intractable, without status migrainosus: Secondary | ICD-10-CM | POA: Diagnosis not present

## 2021-01-06 ENCOUNTER — Other Ambulatory Visit: Payer: Self-pay | Admitting: Neurology

## 2021-01-06 ENCOUNTER — Other Ambulatory Visit: Payer: Self-pay | Admitting: Family Medicine

## 2021-01-06 DIAGNOSIS — G43119 Migraine with aura, intractable, without status migrainosus: Secondary | ICD-10-CM

## 2021-05-01 DIAGNOSIS — L708 Other acne: Secondary | ICD-10-CM | POA: Diagnosis not present

## 2021-05-01 DIAGNOSIS — D2372 Other benign neoplasm of skin of left lower limb, including hip: Secondary | ICD-10-CM | POA: Diagnosis not present

## 2021-05-01 DIAGNOSIS — D235 Other benign neoplasm of skin of trunk: Secondary | ICD-10-CM | POA: Diagnosis not present

## 2021-05-01 DIAGNOSIS — L718 Other rosacea: Secondary | ICD-10-CM | POA: Diagnosis not present

## 2021-07-03 DIAGNOSIS — G43119 Migraine with aura, intractable, without status migrainosus: Secondary | ICD-10-CM | POA: Diagnosis not present

## 2021-07-03 DIAGNOSIS — R9082 White matter disease, unspecified: Secondary | ICD-10-CM | POA: Diagnosis not present

## 2021-07-03 DIAGNOSIS — R030 Elevated blood-pressure reading, without diagnosis of hypertension: Secondary | ICD-10-CM | POA: Diagnosis not present

## 2021-09-09 ENCOUNTER — Encounter: Payer: Self-pay | Admitting: Physician Assistant

## 2021-10-23 ENCOUNTER — Other Ambulatory Visit: Payer: Self-pay | Admitting: Student

## 2021-10-23 DIAGNOSIS — R9082 White matter disease, unspecified: Secondary | ICD-10-CM

## 2021-11-04 ENCOUNTER — Ambulatory Visit
Admission: RE | Admit: 2021-11-04 | Discharge: 2021-11-04 | Disposition: A | Discharge status: Home / Self Care | Payer: 59 | Source: Ambulatory Visit | Attending: Student | Admitting: Student

## 2021-11-04 DIAGNOSIS — R519 Headache, unspecified: Secondary | ICD-10-CM | POA: Diagnosis not present

## 2021-11-04 DIAGNOSIS — R9082 White matter disease, unspecified: Secondary | ICD-10-CM

## 2021-11-11 ENCOUNTER — Other Ambulatory Visit
Admission: RE | Admit: 2021-11-11 | Discharge: 2021-11-11 | Disposition: A | Discharge status: Home / Self Care | Payer: 59 | Source: Ambulatory Visit | Attending: Student | Admitting: Student

## 2021-11-11 DIAGNOSIS — R9082 White matter disease, unspecified: Secondary | ICD-10-CM | POA: Insufficient documentation

## 2021-11-11 DIAGNOSIS — E559 Vitamin D deficiency, unspecified: Secondary | ICD-10-CM | POA: Diagnosis not present

## 2021-11-11 DIAGNOSIS — G43119 Migraine with aura, intractable, without status migrainosus: Secondary | ICD-10-CM | POA: Insufficient documentation

## 2021-11-11 DIAGNOSIS — Z114 Encounter for screening for human immunodeficiency virus [HIV]: Secondary | ICD-10-CM | POA: Diagnosis not present

## 2021-11-11 DIAGNOSIS — E538 Deficiency of other specified B group vitamins: Secondary | ICD-10-CM | POA: Diagnosis not present

## 2021-11-11 DIAGNOSIS — R69 Illness, unspecified: Secondary | ICD-10-CM | POA: Diagnosis not present

## 2021-11-11 LAB — PROTIME-INR
INR: 1 (ref 0.8–1.2)
Prothrombin Time: 12.6 seconds (ref 11.4–15.2)

## 2021-11-11 LAB — APTT: aPTT: 31 seconds (ref 24–36)

## 2021-11-12 ENCOUNTER — Other Ambulatory Visit: Payer: Self-pay | Admitting: Student

## 2021-11-12 DIAGNOSIS — G43119 Migraine with aura, intractable, without status migrainosus: Secondary | ICD-10-CM

## 2021-11-16 ENCOUNTER — Other Ambulatory Visit: Payer: Self-pay | Admitting: Student

## 2021-11-16 DIAGNOSIS — R9082 White matter disease, unspecified: Secondary | ICD-10-CM

## 2021-11-16 DIAGNOSIS — H52213 Irregular astigmatism, bilateral: Secondary | ICD-10-CM | POA: Diagnosis not present

## 2021-11-16 DIAGNOSIS — G43119 Migraine with aura, intractable, without status migrainosus: Secondary | ICD-10-CM

## 2021-11-24 ENCOUNTER — Other Ambulatory Visit: Payer: Self-pay | Admitting: Student

## 2021-11-24 DIAGNOSIS — G43119 Migraine with aura, intractable, without status migrainosus: Secondary | ICD-10-CM

## 2021-11-24 DIAGNOSIS — R9082 White matter disease, unspecified: Secondary | ICD-10-CM

## 2021-11-30 DIAGNOSIS — R9082 White matter disease, unspecified: Secondary | ICD-10-CM | POA: Diagnosis not present

## 2021-12-10 DIAGNOSIS — Z0000 Encounter for general adult medical examination without abnormal findings: Secondary | ICD-10-CM | POA: Diagnosis not present

## 2021-12-10 DIAGNOSIS — R519 Headache, unspecified: Secondary | ICD-10-CM | POA: Diagnosis not present

## 2021-12-10 DIAGNOSIS — R5383 Other fatigue: Secondary | ICD-10-CM | POA: Diagnosis not present

## 2021-12-13 ENCOUNTER — Ambulatory Visit
Admission: RE | Admit: 2021-12-13 | Discharge: 2021-12-13 | Disposition: A | Discharge status: Home / Self Care | Payer: 59 | Source: Ambulatory Visit | Attending: Student | Admitting: Student

## 2021-12-13 DIAGNOSIS — R9082 White matter disease, unspecified: Secondary | ICD-10-CM

## 2021-12-13 DIAGNOSIS — M40204 Unspecified kyphosis, thoracic region: Secondary | ICD-10-CM | POA: Diagnosis not present

## 2021-12-13 DIAGNOSIS — G43919 Migraine, unspecified, intractable, without status migrainosus: Secondary | ICD-10-CM | POA: Diagnosis not present

## 2021-12-13 DIAGNOSIS — S22009A Unspecified fracture of unspecified thoracic vertebra, initial encounter for closed fracture: Secondary | ICD-10-CM | POA: Diagnosis not present

## 2021-12-13 DIAGNOSIS — D1809 Hemangioma of other sites: Secondary | ICD-10-CM | POA: Diagnosis not present

## 2021-12-13 DIAGNOSIS — G43119 Migraine with aura, intractable, without status migrainosus: Secondary | ICD-10-CM

## 2021-12-14 ENCOUNTER — Other Ambulatory Visit: Payer: 59

## 2021-12-14 DIAGNOSIS — R9089 Other abnormal findings on diagnostic imaging of central nervous system: Secondary | ICD-10-CM | POA: Diagnosis not present

## 2021-12-14 DIAGNOSIS — R519 Headache, unspecified: Secondary | ICD-10-CM | POA: Diagnosis not present

## 2021-12-18 DIAGNOSIS — R9089 Other abnormal findings on diagnostic imaging of central nervous system: Secondary | ICD-10-CM | POA: Diagnosis not present

## 2021-12-18 DIAGNOSIS — R930 Abnormal findings on diagnostic imaging of skull and head, not elsewhere classified: Secondary | ICD-10-CM | POA: Diagnosis not present

## 2021-12-29 ENCOUNTER — Other Ambulatory Visit: Payer: 59

## 2022-01-15 DIAGNOSIS — R519 Headache, unspecified: Secondary | ICD-10-CM | POA: Diagnosis not present

## 2022-01-15 DIAGNOSIS — R9089 Other abnormal findings on diagnostic imaging of central nervous system: Secondary | ICD-10-CM | POA: Diagnosis not present

## 2022-03-31 DIAGNOSIS — R69 Illness, unspecified: Secondary | ICD-10-CM | POA: Diagnosis not present

## 2022-04-06 DIAGNOSIS — R69 Illness, unspecified: Secondary | ICD-10-CM | POA: Diagnosis not present

## 2022-10-14 DIAGNOSIS — L708 Other acne: Secondary | ICD-10-CM | POA: Diagnosis not present

## 2022-10-14 DIAGNOSIS — D2261 Melanocytic nevi of right upper limb, including shoulder: Secondary | ICD-10-CM | POA: Diagnosis not present

## 2022-10-14 DIAGNOSIS — D225 Melanocytic nevi of trunk: Secondary | ICD-10-CM | POA: Diagnosis not present

## 2022-10-14 DIAGNOSIS — L718 Other rosacea: Secondary | ICD-10-CM | POA: Diagnosis not present

## 2022-10-14 DIAGNOSIS — D2262 Melanocytic nevi of left upper limb, including shoulder: Secondary | ICD-10-CM | POA: Diagnosis not present

## 2022-10-14 DIAGNOSIS — D2272 Melanocytic nevi of left lower limb, including hip: Secondary | ICD-10-CM | POA: Diagnosis not present

## 2022-12-06 ENCOUNTER — Emergency Department (HOSPITAL_COMMUNITY)
Admission: EM | Admit: 2022-12-06 | Discharge: 2022-12-06 | Discharge status: Against Medical Advice | Payer: 59 | Attending: Emergency Medicine | Admitting: Emergency Medicine

## 2022-12-06 ENCOUNTER — Other Ambulatory Visit: Payer: Self-pay

## 2022-12-06 ENCOUNTER — Encounter (HOSPITAL_COMMUNITY): Payer: Self-pay | Admitting: *Deleted

## 2022-12-06 DIAGNOSIS — R040 Epistaxis: Secondary | ICD-10-CM | POA: Diagnosis not present

## 2022-12-06 DIAGNOSIS — Z5321 Procedure and treatment not carried out due to patient leaving prior to being seen by health care provider: Secondary | ICD-10-CM | POA: Insufficient documentation

## 2022-12-06 MED ORDER — OXYMETAZOLINE HCL 0.05 % NA SOLN
2.0000 | Freq: Once | NASAL | Status: AC
  Administered 2022-12-06: 2 via NASAL
  Filled 2022-12-06: qty 30

## 2022-12-06 NOTE — ED Triage Notes (Signed)
Patient states she woke up at 0345 with a nosebleed , bleeding from the left side., states she has a history of nosebleeds however is usually able to stop them. Today states she feels it like it is running down the back of her throat. Left side is bleeding today. Uses Ayr saline gel every night in her nose. Holding pressure on nose currently

## 2022-12-06 NOTE — ED Provider Triage Note (Signed)
Emergency Medicine Provider Triage Evaluation Note  TIFFINI HAYDUK , a 32 y.o. female  was evaluated in triage.  Pt complains of epistaxis.  It started around 3:50 AM.  No triggering event.  States nosebleeds are not uncommon for her.  Review of Systems  Positive: As above Negative: As above  Physical Exam  BP (!) 155/103 (BP Location: Right Arm)   Pulse (!) 109   Temp (!) 97.4 F (36.3 C)   Resp 18   Ht 5\' 5"  (1.651 m)   Wt 77.6 kg   SpO2 100%   BMI 28.46 kg/m  Gen:   Awake, no distress   Resp:  Normal effort  MSK:   Moves extremities without difficulty  Other:    Medical Decision Making  Medically screening exam initiated at 5:32 AM.  Appropriate orders placed.  JOUA VANDERKOLK was informed that the remainder of the evaluation will be completed by another provider, this initial triage assessment does not replace that evaluation, and the importance of remaining in the ED until their evaluation is complete.    Marita [place], PA-C 12/06/22 [phone]

## 2022-12-06 NOTE — ED Notes (Signed)
Patient states it feels like the bleeding is in the back of her nose no bleeding anterior.

## 2022-12-09 DIAGNOSIS — J310 Chronic rhinitis: Secondary | ICD-10-CM | POA: Diagnosis not present

## 2022-12-13 DIAGNOSIS — Z0000 Encounter for general adult medical examination without abnormal findings: Secondary | ICD-10-CM | POA: Diagnosis not present

## 2022-12-13 DIAGNOSIS — G43809 Other migraine, not intractable, without status migrainosus: Secondary | ICD-10-CM | POA: Diagnosis not present

## 2022-12-13 DIAGNOSIS — E782 Mixed hyperlipidemia: Secondary | ICD-10-CM | POA: Diagnosis not present

## 2022-12-13 DIAGNOSIS — R040 Epistaxis: Secondary | ICD-10-CM | POA: Diagnosis not present

## 2023-04-11 DIAGNOSIS — R002 Palpitations: Secondary | ICD-10-CM | POA: Diagnosis not present

## 2023-04-11 DIAGNOSIS — F419 Anxiety disorder, unspecified: Secondary | ICD-10-CM | POA: Diagnosis not present

## 2023-04-18 DIAGNOSIS — R002 Palpitations: Secondary | ICD-10-CM | POA: Diagnosis not present

## 2023-04-19 DIAGNOSIS — R002 Palpitations: Secondary | ICD-10-CM | POA: Diagnosis not present

## 2023-06-03 DIAGNOSIS — Z6827 Body mass index (BMI) 27.0-27.9, adult: Secondary | ICD-10-CM | POA: Diagnosis not present

## 2023-06-03 DIAGNOSIS — R102 Pelvic and perineal pain: Secondary | ICD-10-CM | POA: Diagnosis not present

## 2023-10-14 DIAGNOSIS — D2372 Other benign neoplasm of skin of left lower limb, including hip: Secondary | ICD-10-CM | POA: Diagnosis not present

## 2023-10-14 DIAGNOSIS — D235 Other benign neoplasm of skin of trunk: Secondary | ICD-10-CM | POA: Diagnosis not present

## 2023-10-14 DIAGNOSIS — L858 Other specified epidermal thickening: Secondary | ICD-10-CM | POA: Diagnosis not present

## 2023-10-14 DIAGNOSIS — D2371 Other benign neoplasm of skin of right lower limb, including hip: Secondary | ICD-10-CM | POA: Diagnosis not present

## 2023-10-14 DIAGNOSIS — L309 Dermatitis, unspecified: Secondary | ICD-10-CM | POA: Diagnosis not present

## 2023-12-14 DIAGNOSIS — E349 Endocrine disorder, unspecified: Secondary | ICD-10-CM | POA: Diagnosis not present

## 2023-12-14 DIAGNOSIS — G43809 Other migraine, not intractable, without status migrainosus: Secondary | ICD-10-CM | POA: Diagnosis not present

## 2023-12-14 DIAGNOSIS — N926 Irregular menstruation, unspecified: Secondary | ICD-10-CM | POA: Diagnosis not present

## 2023-12-14 DIAGNOSIS — E785 Hyperlipidemia, unspecified: Secondary | ICD-10-CM | POA: Diagnosis not present

## 2023-12-14 DIAGNOSIS — R232 Flushing: Secondary | ICD-10-CM | POA: Diagnosis not present

## 2023-12-14 DIAGNOSIS — R635 Abnormal weight gain: Secondary | ICD-10-CM | POA: Diagnosis not present

## 2023-12-14 DIAGNOSIS — N921 Excessive and frequent menstruation with irregular cycle: Secondary | ICD-10-CM | POA: Diagnosis not present

## 2023-12-14 DIAGNOSIS — J3089 Other allergic rhinitis: Secondary | ICD-10-CM | POA: Diagnosis not present

## 2023-12-14 DIAGNOSIS — R5383 Other fatigue: Secondary | ICD-10-CM | POA: Diagnosis not present

## 2023-12-14 DIAGNOSIS — Z0000 Encounter for general adult medical examination without abnormal findings: Secondary | ICD-10-CM | POA: Diagnosis not present

## 2024-02-14 IMAGING — MR MR THORACIC SPINE W/O CM
4 of 6 series · 21 of 48 positions shown · non-contrast
Comparison: Previous brain MRI from 11/04/2021.

CLINICAL DATA: Follow-up examination for white matter abnormality
on prior brain MRI. History of intractable migraines.

EXAM:
MRI THORACIC SPINE WITHOUT CONTRAST
TECHNIQUE: Multiplanar, multisequence MR imaging of the thoracic spine was
performed. No intravenous contrast was administered.

[Series 2: T1 · sagittal · 3.0mm · 0.78mm/px · 3 of 11 slices shown]
[im 1/11]
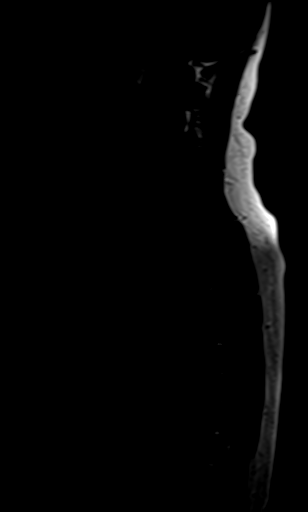
[im 6/11]
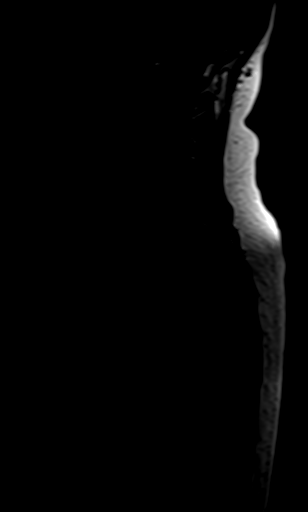
[im 11/11]
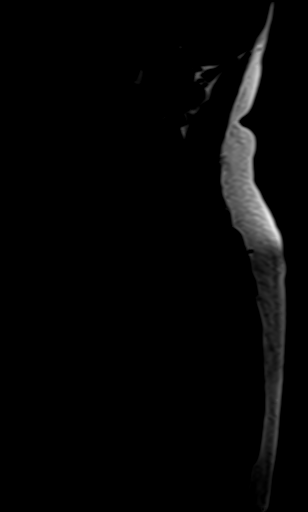

[Series 4: T2 · sagittal · 4.0mm · 0.48mm/px · 5 of 16 slices shown (1 of 3)]
[im 1/16]
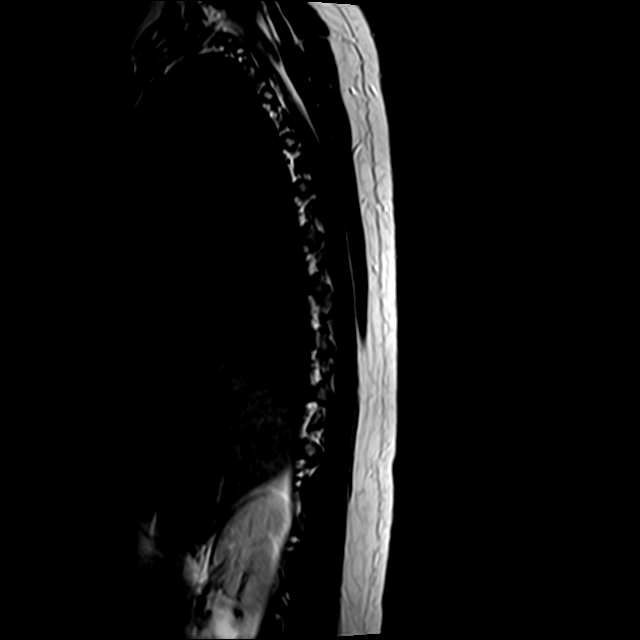
[im 4/16]
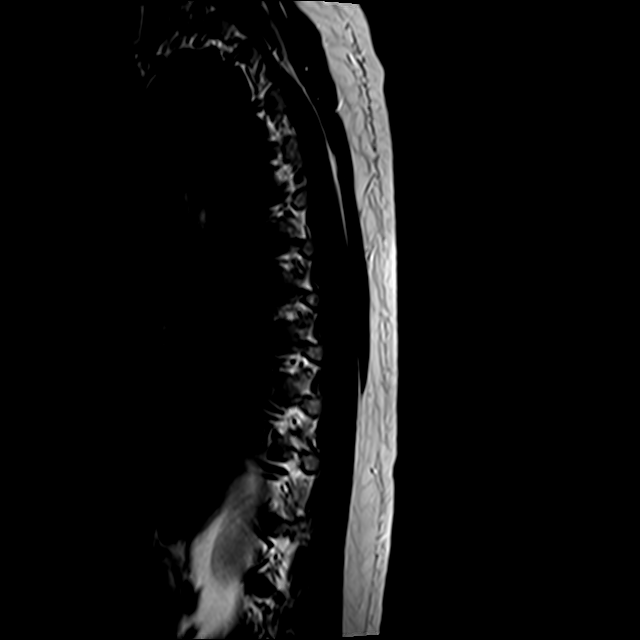
[im 8/16]
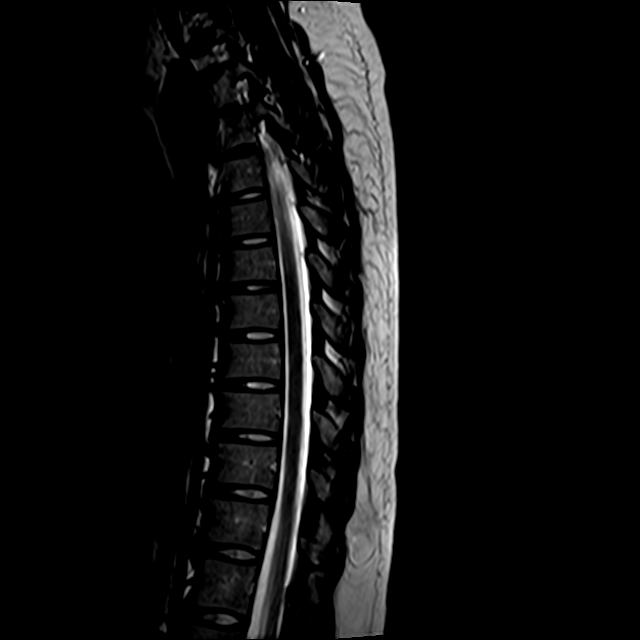
[im 12/16]
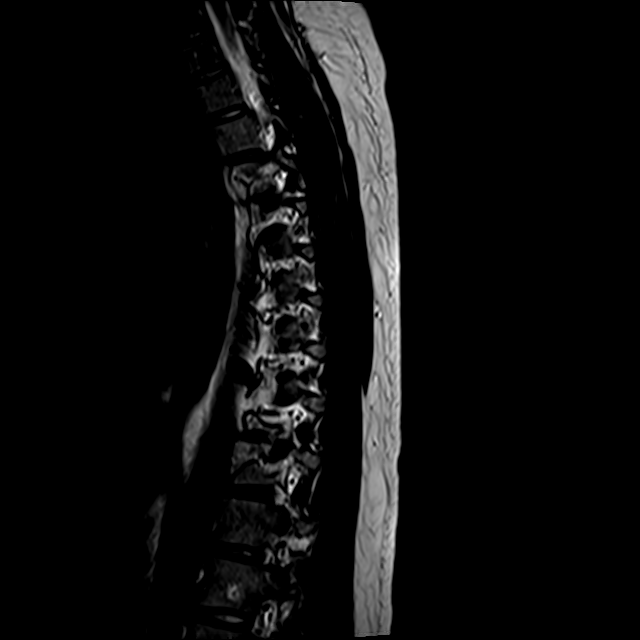
[im 16/16]
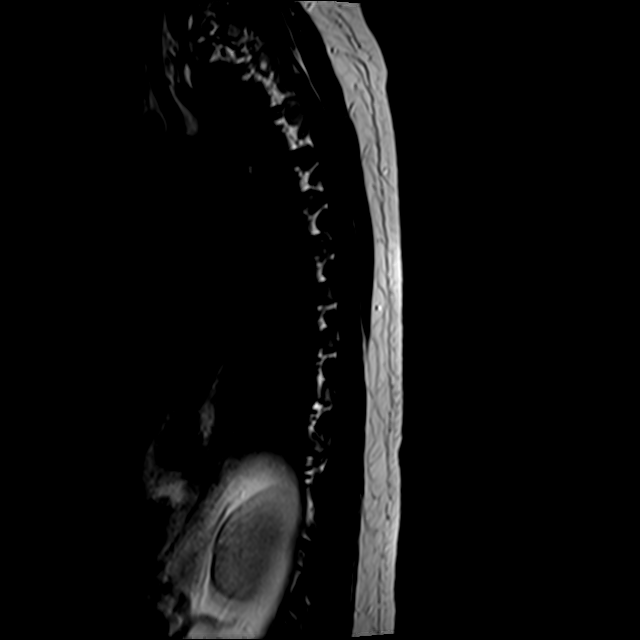

[Series 8: T2 · axial · 4.0mm · 0.39mm/px · z∈[-267,-0]mm · 8 of 39 slices shown (2 of 3)]
[im 1/39]
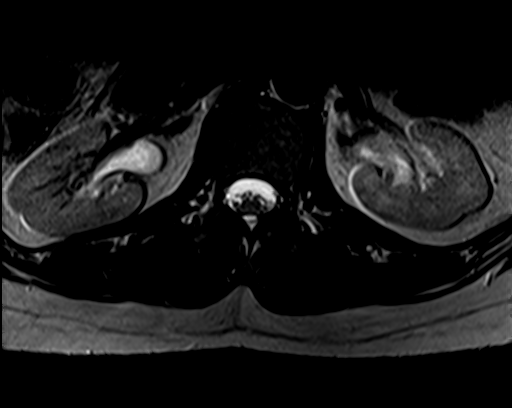
[im 6/39]
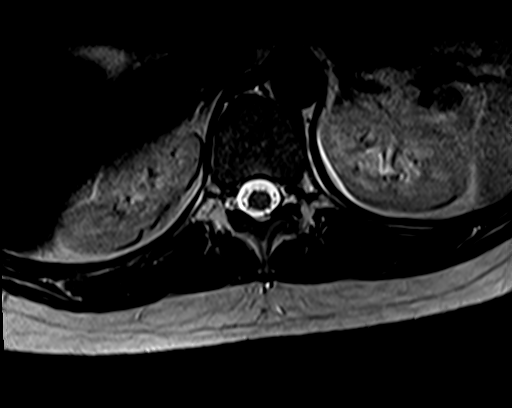
[im 12/39]
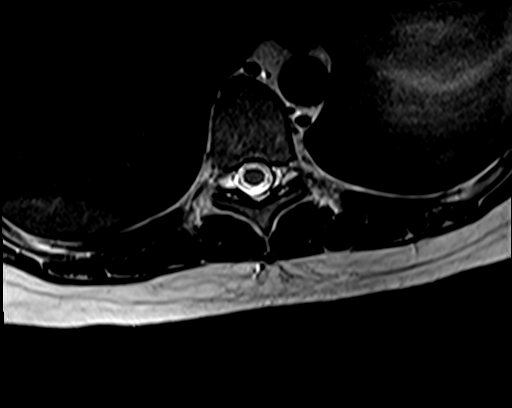
[im 18/39]
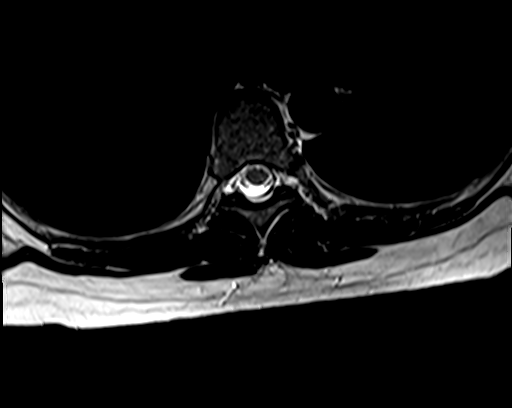
[im 21/39]
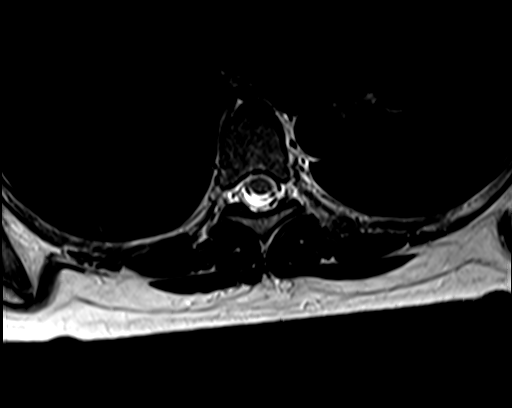
[im 27/39]
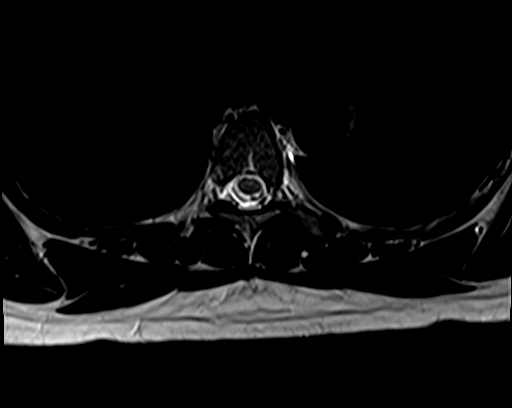
[im 33/39]
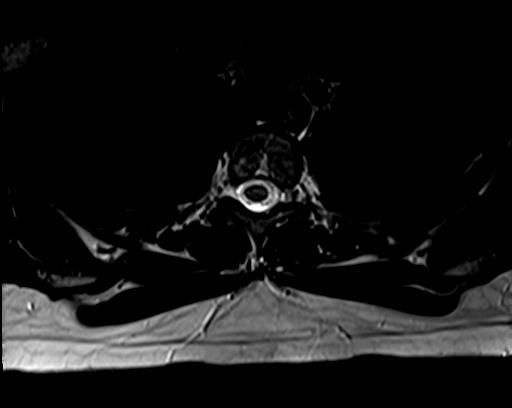
[im 39/39]
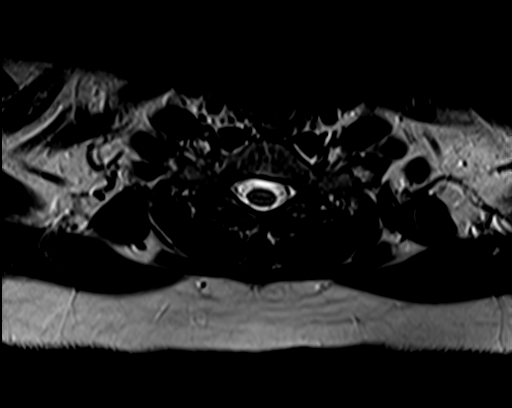

[Series 9: T2 · axial · 4.0mm · 0.39mm/px · z∈[-270,-40]mm · 5 of 40 slices shown (3 of 3)]
[im 1/40]
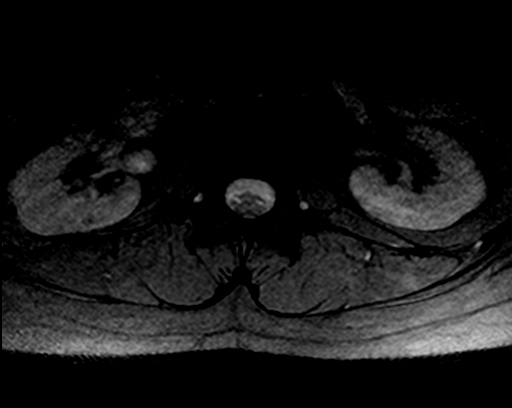
[im 7/40]
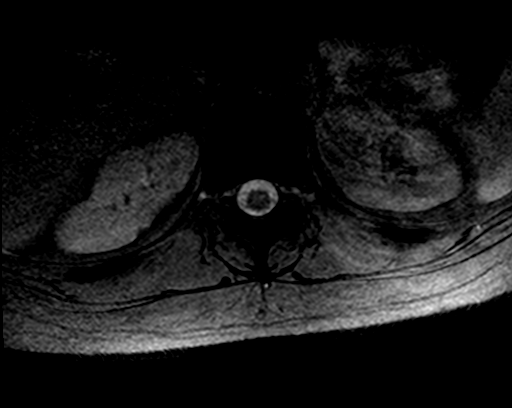
[im 13/40]
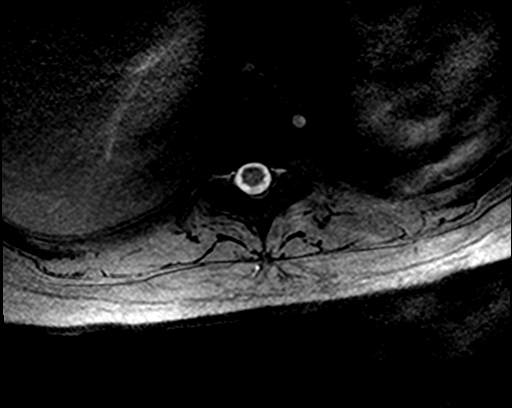
[im 22/40]
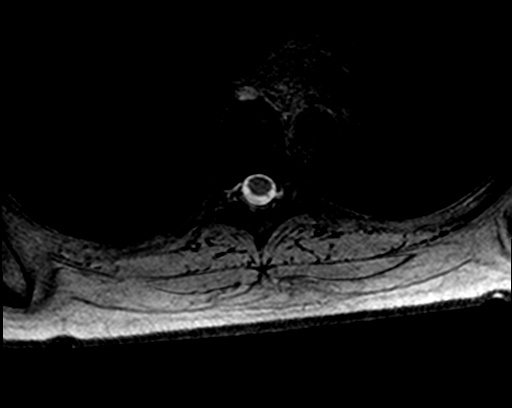
[im 34/40]
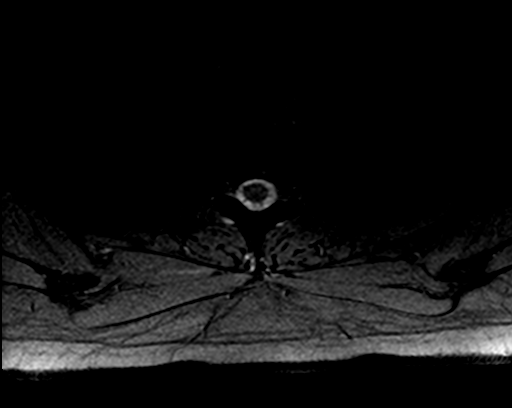

[21 of 48 positions shown; findings below may reference images not displayed]

FINDINGS: Alignment: Mild roto dextroscoliosis. Alignment otherwise normal
with preservation of the normal thoracic kyphosis. No listhesis.

Vertebrae: Vertebral body height well maintained without acute or
chronic fracture. Bone marrow signal intensity within normal limits.
Few small benign hemangiomata noted. No worrisome osseous lesions.
No abnormal marrow edema.

Cord: Normal signal and morphology. No signal changes to suggest
demyelinating disease.

Paraspinal and other soft tissues: Unremarkable.

Disc levels:

No significant disc pathology seen within the thoracic spine.
Intervertebral discs are well hydrated with preserved disc height.
No disc bulge or focal disc herniation. No stenosis or impingement.
IMPRESSION: Normal MRI of the thoracic spine and spinal cord. No cord signal
abnormality to suggest demyelinating disease. No significant disc
pathology, stenosis, or neural impingement.
